# Patient Record
Sex: Female | Born: 1935 | Race: White | Hispanic: No | Smoking: Former smoker
Health system: Southern US, Community
[De-identification: ages and names within clinical notes are randomized; demographics above are authoritative.]

## PROBLEM LIST (undated history)

## (undated) DIAGNOSIS — M1990 Unspecified osteoarthritis, unspecified site: Secondary | ICD-10-CM

## (undated) DIAGNOSIS — C539 Malignant neoplasm of cervix uteri, unspecified: Secondary | ICD-10-CM

## (undated) DIAGNOSIS — C3490 Malignant neoplasm of unspecified part of unspecified bronchus or lung: Secondary | ICD-10-CM

## (undated) DIAGNOSIS — H269 Unspecified cataract: Secondary | ICD-10-CM

## (undated) DIAGNOSIS — I10 Essential (primary) hypertension: Secondary | ICD-10-CM

## (undated) DIAGNOSIS — K219 Gastro-esophageal reflux disease without esophagitis: Secondary | ICD-10-CM

## (undated) DIAGNOSIS — G4733 Obstructive sleep apnea (adult) (pediatric): Secondary | ICD-10-CM

## (undated) DIAGNOSIS — G4730 Sleep apnea, unspecified: Secondary | ICD-10-CM

## (undated) DIAGNOSIS — H3530 Unspecified macular degeneration: Secondary | ICD-10-CM

## (undated) HISTORY — DX: Unspecified macular degeneration: H35.30

## (undated) HISTORY — DX: Unspecified osteoarthritis, unspecified site: M19.90

## (undated) HISTORY — DX: Unspecified cataract: H26.9

## (undated) HISTORY — DX: Sleep apnea, unspecified: G47.30

---

## 1995-05-26 DIAGNOSIS — C539 Malignant neoplasm of cervix uteri, unspecified: Secondary | ICD-10-CM

## 1995-05-26 HISTORY — DX: Malignant neoplasm of cervix uteri, unspecified: C53.9

## 1995-05-26 HISTORY — PX: ABDOMINAL HYSTERECTOMY: SHX81

## 2000-05-25 HISTORY — PX: BREAST BIOPSY: SHX20

## 2004-11-28 ENCOUNTER — Ambulatory Visit: Payer: Self-pay | Admitting: Internal Medicine

## 2005-12-01 ENCOUNTER — Ambulatory Visit: Payer: Self-pay | Admitting: Internal Medicine

## 2006-12-28 ENCOUNTER — Ambulatory Visit: Payer: Self-pay | Admitting: Internal Medicine

## 2007-12-29 ENCOUNTER — Ambulatory Visit: Payer: Self-pay | Admitting: Internal Medicine

## 2008-06-25 ENCOUNTER — Ambulatory Visit: Payer: Self-pay | Admitting: Internal Medicine

## 2008-07-04 ENCOUNTER — Ambulatory Visit: Payer: Self-pay

## 2008-07-10 ENCOUNTER — Ambulatory Visit: Payer: Self-pay | Admitting: Internal Medicine

## 2008-07-23 ENCOUNTER — Ambulatory Visit: Payer: Self-pay | Admitting: Internal Medicine

## 2008-08-07 ENCOUNTER — Ambulatory Visit: Payer: Self-pay | Admitting: Physician Assistant

## 2008-09-22 ENCOUNTER — Ambulatory Visit: Payer: Self-pay | Admitting: Internal Medicine

## 2008-09-25 ENCOUNTER — Ambulatory Visit: Payer: Self-pay | Admitting: Internal Medicine

## 2008-10-23 ENCOUNTER — Ambulatory Visit: Payer: Self-pay | Admitting: Internal Medicine

## 2008-11-22 ENCOUNTER — Ambulatory Visit: Payer: Self-pay | Admitting: Internal Medicine

## 2008-12-10 ENCOUNTER — Ambulatory Visit: Payer: Self-pay | Admitting: Specialist

## 2008-12-12 ENCOUNTER — Ambulatory Visit: Payer: Self-pay | Admitting: Internal Medicine

## 2008-12-23 ENCOUNTER — Ambulatory Visit: Payer: Self-pay | Admitting: Internal Medicine

## 2009-01-08 ENCOUNTER — Ambulatory Visit: Payer: Self-pay | Admitting: Internal Medicine

## 2009-02-25 ENCOUNTER — Ambulatory Visit: Payer: Self-pay | Admitting: Gastroenterology

## 2009-03-25 ENCOUNTER — Ambulatory Visit: Payer: Self-pay | Admitting: Internal Medicine

## 2009-04-08 ENCOUNTER — Ambulatory Visit: Payer: Self-pay | Admitting: Specialist

## 2009-04-12 ENCOUNTER — Ambulatory Visit: Payer: Self-pay | Admitting: Internal Medicine

## 2009-04-24 ENCOUNTER — Ambulatory Visit: Payer: Self-pay | Admitting: Internal Medicine

## 2009-10-11 ENCOUNTER — Ambulatory Visit: Payer: Self-pay | Admitting: Internal Medicine

## 2009-10-23 ENCOUNTER — Ambulatory Visit: Payer: Self-pay | Admitting: Internal Medicine

## 2009-11-27 ENCOUNTER — Ambulatory Visit: Payer: Self-pay | Admitting: Ophthalmology

## 2010-01-22 ENCOUNTER — Ambulatory Visit: Payer: Self-pay | Admitting: Internal Medicine

## 2010-02-19 ENCOUNTER — Ambulatory Visit: Payer: Self-pay | Admitting: Ophthalmology

## 2010-04-11 ENCOUNTER — Ambulatory Visit: Payer: Self-pay | Admitting: Internal Medicine

## 2010-04-24 ENCOUNTER — Ambulatory Visit: Payer: Self-pay | Admitting: Internal Medicine

## 2010-05-25 HISTORY — PX: COLONOSCOPY: SHX174

## 2010-10-15 ENCOUNTER — Ambulatory Visit: Payer: Self-pay | Admitting: Otolaryngology

## 2010-10-16 ENCOUNTER — Ambulatory Visit: Payer: Self-pay | Admitting: Otolaryngology

## 2010-10-17 LAB — PATHOLOGY REPORT

## 2011-03-11 ENCOUNTER — Ambulatory Visit: Payer: Self-pay | Admitting: Specialist

## 2011-03-25 ENCOUNTER — Ambulatory Visit: Payer: Self-pay | Admitting: Internal Medicine

## 2011-09-11 ENCOUNTER — Ambulatory Visit: Payer: Self-pay | Admitting: Specialist

## 2011-09-11 LAB — CREATININE, SERUM
Creatinine: 0.78 mg/dL (ref 0.60–1.30)
EGFR (African American): >60
EGFR (Non-African Amer.): >60

## 2012-03-23 ENCOUNTER — Ambulatory Visit: Payer: Self-pay | Admitting: Specialist

## 2012-03-28 ENCOUNTER — Ambulatory Visit: Payer: Self-pay | Admitting: Internal Medicine

## 2012-09-21 ENCOUNTER — Ambulatory Visit: Payer: Self-pay | Admitting: Specialist

## 2013-03-29 ENCOUNTER — Ambulatory Visit: Payer: Self-pay | Admitting: Specialist

## 2013-03-29 ENCOUNTER — Ambulatory Visit: Payer: Self-pay | Admitting: Internal Medicine

## 2013-12-11 ENCOUNTER — Ambulatory Visit: Payer: Self-pay | Admitting: General Practice

## 2013-12-14 ENCOUNTER — Ambulatory Visit: Payer: Self-pay | Admitting: General Practice

## 2013-12-14 LAB — BASIC METABOLIC PANEL
Anion Gap: 6 — ABNORMAL LOW (ref 7–16)
BUN: 17 mg/dL (ref 7–18)
Calcium, Total: 9.8 mg/dL (ref 8.5–10.1)
Chloride: 102 mmol/L (ref 98–107)
Co2: 29 mmol/L (ref 21–32)
Creatinine: 0.81 mg/dL (ref 0.60–1.30)
EGFR (African American): >60
EGFR (Non-African Amer.): >60
Glucose: 110 mg/dL — ABNORMAL HIGH (ref 65–99)
Osmolality: 276 (ref 275–301)
Potassium: 4.1 mmol/L (ref 3.5–5.1)
Sodium: 137 mmol/L (ref 136–145)

## 2013-12-14 LAB — URINALYSIS, COMPLETE
Bilirubin,UR: NEGATIVE
Blood: NEGATIVE
Glucose,UR: NEGATIVE mg/dL (ref 0–75)
Ketone: NEGATIVE
Nitrite: POSITIVE
Ph: 7 (ref 4.5–8.0)
Protein: NEGATIVE
RBC,UR: 1 /HPF (ref 0–5)
Specific Gravity: 1.008 (ref 1.003–1.030)
Squamous Epithelial: <1
WBC UR: 69 /HPF (ref 0–5)

## 2013-12-14 LAB — MRSA PCR SCREENING

## 2013-12-14 LAB — APTT: Activated PTT: 31.7 secs (ref 23.6–35.9)

## 2013-12-14 LAB — CBC
HCT: 39.9 % (ref 35.0–47.0)
HGB: 13.6 g/dL (ref 12.0–16.0)
MCH: 29.4 pg (ref 26.0–34.0)
MCHC: 34 g/dL (ref 32.0–36.0)
MCV: 86 fL (ref 80–100)
Platelet: 149 10*3/uL — ABNORMAL LOW (ref 150–440)
RBC: 4.61 10*6/uL (ref 3.80–5.20)
RDW: 15.4 % — ABNORMAL HIGH (ref 11.5–14.5)
WBC: 9.7 10*3/uL (ref 3.6–11.0)

## 2013-12-14 LAB — SEDIMENTATION RATE: Erythrocyte Sed Rate: 25 mm/hr (ref 0–30)

## 2013-12-14 LAB — PROTIME-INR
INR: 1
Prothrombin Time: 13 secs (ref 11.5–14.7)

## 2013-12-15 ENCOUNTER — Ambulatory Visit: Payer: Self-pay | Admitting: General Practice

## 2013-12-16 LAB — BASIC METABOLIC PANEL
Anion Gap: 5 — ABNORMAL LOW (ref 7–16)
BUN: 16 mg/dL (ref 7–18)
Calcium, Total: 8 mg/dL — ABNORMAL LOW (ref 8.5–10.1)
Chloride: 106 mmol/L (ref 98–107)
Co2: 29 mmol/L (ref 21–32)
Creatinine: 0.99 mg/dL (ref 0.60–1.30)
EGFR (African American): >60
EGFR (Non-African Amer.): 53 — ABNORMAL LOW
Glucose: 97 mg/dL (ref 65–99)
Osmolality: 281 (ref 275–301)
Potassium: 4.1 mmol/L (ref 3.5–5.1)
Sodium: 140 mmol/L (ref 136–145)

## 2013-12-16 LAB — HEMOGLOBIN: HGB: 10.8 g/dL — ABNORMAL LOW (ref 12.0–16.0)

## 2013-12-16 LAB — PLATELET COUNT: Platelet: 128 10*3/uL — ABNORMAL LOW (ref 150–440)

## 2013-12-16 LAB — URINE CULTURE

## 2014-04-06 ENCOUNTER — Ambulatory Visit: Payer: Self-pay | Admitting: Internal Medicine

## 2014-09-15 NOTE — Op Note (Signed)
PATIENT NAME:  Kim Newman, Kim Newman MR#:  [id number] DATE OF BIRTH:  1927/10/12  DATE OF PROCEDURE:  12/15/2013  PREOPERATIVE DIAGNOSIS: Left proximal humerus fracture.   POSTOPERATIVE DIAGNOSIS: Left proximal humerus fracture.   PROCEDURE PERFORMED: Open reduction and internal fixation of left proximal humerus fracture.   SURGEON: Dr. Skip Estimable.   ANESTHESIA: Interscalene block and general.   ESTIMATED BLOOD LOSS: 500 mL.   FLUIDS REPLACED: 1000 mL of crystalloid.   DRAINS: One small drain to Hemovac reservoir.   IMPLANTS UTILIZED: Biomet 3-hole S3 proximal humerus plate, six 4.0-mm pegs, and three 3.8-mm cortical screws.   INDICATIONS FOR SURGERY: The patient is an 81 year old right-hand dominant female who fell and landed on her left shoulder. X-rays and CT scan demonstrated a left proximal humerus fracture. After discussion of the risks and benefits of surgical intervention, the patient expressed understanding of the risks and benefits and agreed with plans for surgical intervention.   PROCEDURE IN DETAIL: The patient was brought into the operating room and after adequate interscalene block and general endotracheal anesthesia was achieved, the patient is placed in the modified beach chair position. The head was secured in headrest and all bony prominences were well padded. The patient's left shoulder and arm were cleaned and prepped with alcohol and DuraPrep and draped in the usual sterile fashion. A "timeout" was performed as per usual protocol. The anticipated incision site was injected was 0.25% Marcaine with epinephrine. An anterior oblique incision was made extending from the midportion of the clavicle distally toward the insertion of the deltoid. Dissection was carried out so as to find the cephalic vein, which was then carefully retracted laterally. Deltopectoral interval was developed and dissection was carried down to the proximal humerus. A moderate-sized hematoma was evacuated.  Fracture site was visualized and provisional reduction was performed and confirmed using C-arm. The 3-hole S3 proximal humerus plate was then positioned, and a central K wire was inserted into the humeral head with position confirmed in multiple planes. Next, the distal portion of the plate was secured to the humeral shaft using a 3.5-mm cortical screw through the slotted hole. Good position was noted. A total of six 4.0-mm smooth pegs were inserted into the humeral head and position was confirmed using FluoroScan. Good position was noted. Next, the distal portion of the plate was further secured to the humeral shaft with 2 additional 3.8-mm cortical screws. The shoulder was placed through range of motion, with good stabilization of the fracture site noted. The wound was irrigated with copious amounts of normal saline with antibiotic solution using bulb syringe. A small Hemovac drain was placed in the wound bed and brought out through a separate stab incision. Good hemostasis was appreciated. The deltopectoral interval was reapproximated using interrupted sutures of #1 Ethibond. Subcutaneous tissue was approximated in layers using first number 0 Vicryl followed by number 2-0 Vicryl. Skin was closed with skin staples. Sterile dressing was applied followed by application of Polar Care and a sling. The patient tolerated the procedure well. She was transported to the recovery room in stable condition.    ____________________________ Laurice Record. Holley Bouche., MD jph:lt D: 12/15/2013 19:59:01 ET T: 12/16/2013 05:26:42 ET JOB#: [id number]  cc: Laurice Record. Holley Bouche., MD, <Dictator> Laurice Record Holley Bouche MD ELECTRONICALLY SIGNED 12/19/2013 23:54

## 2015-04-15 ENCOUNTER — Other Ambulatory Visit: Payer: Self-pay | Admitting: Internal Medicine

## 2015-04-15 DIAGNOSIS — Z1231 Encounter for screening mammogram for malignant neoplasm of breast: Secondary | ICD-10-CM

## 2015-06-19 ENCOUNTER — Other Ambulatory Visit: Payer: Self-pay | Admitting: Internal Medicine

## 2015-06-19 ENCOUNTER — Ambulatory Visit
Admission: RE | Admit: 2015-06-19 | Discharge: 2015-06-19 | Disposition: A | Discharge status: Home / Self Care | Payer: Medicare Other | Source: Ambulatory Visit | Attending: Internal Medicine | Admitting: Internal Medicine

## 2015-06-19 DIAGNOSIS — Z1231 Encounter for screening mammogram for malignant neoplasm of breast: Secondary | ICD-10-CM

## 2015-06-19 HISTORY — DX: Malignant neoplasm of cervix uteri, unspecified: C53.9

## 2015-11-21 ENCOUNTER — Ambulatory Visit: Payer: Medicare Other | Attending: Specialist

## 2015-11-21 DIAGNOSIS — G4761 Periodic limb movement disorder: Secondary | ICD-10-CM | POA: Diagnosis not present

## 2015-11-21 DIAGNOSIS — R400 Somnolence: Secondary | ICD-10-CM | POA: Diagnosis present

## 2015-11-21 DIAGNOSIS — G4710 Hypersomnia, unspecified: Secondary | ICD-10-CM | POA: Insufficient documentation

## 2015-11-21 DIAGNOSIS — G4733 Obstructive sleep apnea (adult) (pediatric): Secondary | ICD-10-CM | POA: Insufficient documentation

## 2016-01-09 ENCOUNTER — Other Ambulatory Visit: Payer: Self-pay | Admitting: Specialist

## 2016-01-09 DIAGNOSIS — J8410 Pulmonary fibrosis, unspecified: Secondary | ICD-10-CM

## 2016-01-21 ENCOUNTER — Ambulatory Visit
Admission: RE | Admit: 2016-01-21 | Discharge: 2016-01-21 | Disposition: A | Discharge status: Home / Self Care | Payer: Medicare Other | Source: Ambulatory Visit | Attending: Specialist | Admitting: Specialist

## 2016-01-21 DIAGNOSIS — I2510 Atherosclerotic heart disease of native coronary artery without angina pectoris: Secondary | ICD-10-CM | POA: Diagnosis not present

## 2016-01-21 DIAGNOSIS — J8410 Pulmonary fibrosis, unspecified: Secondary | ICD-10-CM | POA: Diagnosis not present

## 2016-01-21 DIAGNOSIS — I700 Atherosclerosis of aorta: Secondary | ICD-10-CM | POA: Insufficient documentation

## 2016-01-21 DIAGNOSIS — J479 Bronchiectasis, uncomplicated: Secondary | ICD-10-CM | POA: Insufficient documentation

## 2016-03-17 ENCOUNTER — Inpatient Hospital Stay
Admission: EM | Admit: 2016-03-17 | Discharge: 2016-03-18 | DRG: 811 | Disposition: A | Discharge status: Home / Self Care | Payer: Medicare Other | Attending: Specialist | Admitting: Specialist

## 2016-03-17 ENCOUNTER — Encounter: Payer: Self-pay | Admitting: Emergency Medicine

## 2016-03-17 DIAGNOSIS — G4733 Obstructive sleep apnea (adult) (pediatric): Secondary | ICD-10-CM | POA: Diagnosis present

## 2016-03-17 DIAGNOSIS — R531 Weakness: Secondary | ICD-10-CM | POA: Diagnosis present

## 2016-03-17 DIAGNOSIS — Z8541 Personal history of malignant neoplasm of cervix uteri: Secondary | ICD-10-CM

## 2016-03-17 DIAGNOSIS — Z79899 Other long term (current) drug therapy: Secondary | ICD-10-CM | POA: Diagnosis not present

## 2016-03-17 DIAGNOSIS — Z66 Do not resuscitate: Secondary | ICD-10-CM | POA: Diagnosis present

## 2016-03-17 DIAGNOSIS — D509 Iron deficiency anemia, unspecified: Secondary | ICD-10-CM | POA: Diagnosis not present

## 2016-03-17 DIAGNOSIS — Z7982 Long term (current) use of aspirin: Secondary | ICD-10-CM | POA: Diagnosis not present

## 2016-03-17 DIAGNOSIS — I10 Essential (primary) hypertension: Secondary | ICD-10-CM | POA: Diagnosis not present

## 2016-03-17 DIAGNOSIS — E785 Hyperlipidemia, unspecified: Secondary | ICD-10-CM | POA: Diagnosis not present

## 2016-03-17 DIAGNOSIS — K219 Gastro-esophageal reflux disease without esophagitis: Secondary | ICD-10-CM | POA: Diagnosis present

## 2016-03-17 DIAGNOSIS — J8410 Pulmonary fibrosis, unspecified: Secondary | ICD-10-CM | POA: Diagnosis not present

## 2016-03-17 DIAGNOSIS — R0602 Shortness of breath: Secondary | ICD-10-CM

## 2016-03-17 DIAGNOSIS — R262 Difficulty in walking, not elsewhere classified: Secondary | ICD-10-CM

## 2016-03-17 DIAGNOSIS — Z6828 Body mass index (BMI) 28.0-28.9, adult: Secondary | ICD-10-CM | POA: Diagnosis not present

## 2016-03-17 DIAGNOSIS — E43 Unspecified severe protein-calorie malnutrition: Secondary | ICD-10-CM | POA: Diagnosis present

## 2016-03-17 DIAGNOSIS — Z7951 Long term (current) use of inhaled steroids: Secondary | ICD-10-CM | POA: Diagnosis not present

## 2016-03-17 DIAGNOSIS — D649 Anemia, unspecified: Secondary | ICD-10-CM | POA: Diagnosis present

## 2016-03-17 HISTORY — DX: Gastro-esophageal reflux disease without esophagitis: K21.9

## 2016-03-17 HISTORY — DX: Essential (primary) hypertension: I10

## 2016-03-17 HISTORY — DX: Obstructive sleep apnea (adult) (pediatric): G47.33

## 2016-03-17 LAB — COMPREHENSIVE METABOLIC PANEL
ALT: 25 U/L (ref 14–54)
AST: 41 U/L (ref 15–41)
Albumin: 3.8 g/dL (ref 3.5–5.0)
Alkaline Phosphatase: 74 U/L (ref 38–126)
Anion gap: 10 (ref 5–15)
BUN: 17 mg/dL (ref 6–20)
CO2: 23 mmol/L (ref 22–32)
Calcium: 8.9 mg/dL (ref 8.9–10.3)
Chloride: 100 mmol/L — ABNORMAL LOW (ref 101–111)
Creatinine, Ser: 1.07 mg/dL — ABNORMAL HIGH (ref 0.44–1.00)
GFR calc Af Amer: 53 mL/min — ABNORMAL LOW (ref >60)
GFR calc non Af Amer: 46 mL/min — ABNORMAL LOW (ref >60)
Glucose, Bld: 158 mg/dL — ABNORMAL HIGH (ref 65–99)
Potassium: 4 mmol/L (ref 3.5–5.1)
Sodium: 133 mmol/L — ABNORMAL LOW (ref 135–145)
Total Bilirubin: 0.2 mg/dL — ABNORMAL LOW (ref 0.3–1.2)
Total Protein: 7.5 g/dL (ref 6.5–8.1)

## 2016-03-17 LAB — CBC
HCT: 20.2 % — ABNORMAL LOW (ref 35.0–47.0)
Hemoglobin: 5.9 g/dL — ABNORMAL LOW (ref 12.0–16.0)
MCH: 17.9 pg — ABNORMAL LOW (ref 26.0–34.0)
MCHC: 29.4 g/dL — ABNORMAL LOW (ref 32.0–36.0)
MCV: 60.9 fL — ABNORMAL LOW (ref 80.0–100.0)
Platelets: 176 10*3/uL (ref 150–440)
RBC: 3.31 MIL/uL — ABNORMAL LOW (ref 3.80–5.20)
RDW: 18.4 % — ABNORMAL HIGH (ref 11.5–14.5)
WBC: 7.2 10*3/uL (ref 3.6–11.0)

## 2016-03-17 LAB — URINALYSIS COMPLETE WITH MICROSCOPIC (ARMC ONLY)
Bacteria, UA: NONE SEEN
Bilirubin Urine: NEGATIVE
Glucose, UA: NEGATIVE mg/dL
Hgb urine dipstick: NEGATIVE
Ketones, ur: NEGATIVE mg/dL
Nitrite: NEGATIVE
Protein, ur: NEGATIVE mg/dL
Specific Gravity, Urine: 1.004 — ABNORMAL LOW (ref 1.005–1.030)
pH: 6 (ref 5.0–8.0)

## 2016-03-17 LAB — TROPONIN I: Troponin I: <0.03 ng/mL (ref <0.03)

## 2016-03-17 LAB — FERRITIN: Ferritin: 6 ng/mL — ABNORMAL LOW (ref 11–307)

## 2016-03-17 LAB — IRON AND TIBC
Iron: 8 ug/dL — ABNORMAL LOW (ref 28–170)
Saturation Ratios: 2 % — ABNORMAL LOW (ref 10.4–31.8)
TIBC: 536 ug/dL — ABNORMAL HIGH (ref 250–450)
UIBC: 529 ug/dL

## 2016-03-17 LAB — PREPARE RBC (CROSSMATCH)

## 2016-03-17 LAB — ABO/RH: ABO/RH(D): O POS

## 2016-03-17 MED ORDER — ACETAMINOPHEN 325 MG PO TABS: 650.0000 mg | ORAL_TABLET | Freq: Four times a day (QID) | ORAL | Status: DC | PRN

## 2016-03-17 MED ORDER — ONDANSETRON HCL 4 MG PO TABS: 4.0000 mg | ORAL_TABLET | Freq: Four times a day (QID) | ORAL | Status: DC | PRN

## 2016-03-17 MED ORDER — PANTOPRAZOLE SODIUM 40 MG PO TBEC
40.0000 mg | DELAYED_RELEASE_TABLET | Freq: Every day | ORAL | Status: DC
  Administered 2016-03-18: 40 mg via ORAL
  Filled 2016-03-17: qty 1

## 2016-03-17 MED ORDER — ONDANSETRON HCL 4 MG/2ML IJ SOLN: 4.0000 mg | Freq: Four times a day (QID) | INTRAMUSCULAR | Status: DC | PRN

## 2016-03-17 MED ORDER — MOMETASONE FURO-FORMOTEROL FUM 100-5 MCG/ACT IN AERO
2.0000 | INHALATION_SPRAY | Freq: Two times a day (BID) | RESPIRATORY_TRACT | Status: DC
  Administered 2016-03-18: 09:00:00 2 via RESPIRATORY_TRACT
  Filled 2016-03-17: qty 8.8

## 2016-03-17 MED ORDER — LORATADINE 10 MG PO TABS
10.0000 mg | ORAL_TABLET | Freq: Every day | ORAL | Status: DC
  Administered 2016-03-18: 09:00:00 10 mg via ORAL
  Filled 2016-03-17: qty 1

## 2016-03-17 MED ORDER — ACETAMINOPHEN 650 MG RE SUPP: 650.0000 mg | Freq: Four times a day (QID) | RECTAL | Status: DC | PRN

## 2016-03-17 MED ORDER — ASPIRIN EC 81 MG PO TBEC: 81.0000 mg | DELAYED_RELEASE_TABLET | Freq: Every day | ORAL | Status: DC

## 2016-03-17 MED ORDER — SODIUM CHLORIDE 0.9 % IV SOLN
10.0000 mL/h | Freq: Once | INTRAVENOUS | Status: AC
  Administered 2016-03-17: 10 mL/h via INTRAVENOUS

## 2016-03-17 MED ORDER — LOSARTAN POTASSIUM 25 MG PO TABS
50.0000 mg | ORAL_TABLET | Freq: Every day | ORAL | Status: DC
  Administered 2016-03-18: 50 mg via ORAL
  Filled 2016-03-17: qty 2

## 2016-03-17 NOTE — H&P (Signed)
[place] [place] NAME: Kim Newman    MR#:  [id number]  DATE OF BIRTH:  01-14-1928   DATE OF ADMISSION:  03/17/2016  PRIMARY CARE PHYSICIAN: Madelyn Brunner, MD   REQUESTING/REFERRING PHYSICIAN: Alfred Levins  CHIEF COMPLAINT:   Chief Complaint  Patient presents with  . Anemia  . Weakness    HISTORY OF PRESENT ILLNESS:  Kim Newman  is a 82 y.o. female with a known history of Essential hypertension, obstructive sleep apnea who is presenting with weakness She describes weakness progressively worsening for over a month workup with PCP revealed significant anemia sent to Hospital further workup and evaluation. She denies any bruising bleeding or other abnormal symptoms other than weakness. Denies any weight loss  PAST MEDICAL HISTORY:   Past Medical History:  Diagnosis Date  . Cervical cancer ([place]) 1997    PAST SURGICAL HISTORY:   Past Surgical History:  Procedure Laterality Date  . BREAST BIOPSY     core 2002    SOCIAL HISTORY:   Social History  Substance Use Topics  . Smoking status: Never Smoker  . Smokeless tobacco: Current User  . Alcohol use No    FAMILY HISTORY:   Family History  Problem Relation Age of Onset  . Breast cancer Neg Hx     DRUG ALLERGIES:   Allergies  Allergen Reactions  . Statins   . Strawberry (Diagnostic)     REVIEW OF SYSTEMS:  REVIEW OF SYSTEMS:  CONSTITUTIONAL: Denies fevers, chills,Positive fatigue, weakness.  EYES: Denies blurred vision, double vision, or eye pain.  EARS, NOSE, THROAT: Denies tinnitus, ear pain, hearing loss.  RESPIRATORY: denies cough, shortness of breath, wheezing  CARDIOVASCULAR: Denies chest pain, palpitations, edema.  GASTROINTESTINAL: Denies nausea, vomiting, diarrhea, abdominal pain.  GENITOURINARY: Denies dysuria, hematuria.  ENDOCRINE: Denies nocturia or thyroid problems. HEMATOLOGIC AND LYMPHATIC: Denies easy bruising or bleeding.  SKIN:  Denies rash or lesions.  MUSCULOSKELETAL: Denies pain in neck, back, shoulder, knees, hips, or further arthritic symptoms.  NEUROLOGIC: Denies paralysis, paresthesias.  PSYCHIATRIC: Denies anxiety or depressive symptoms. Otherwise full review of systems performed by me is negative.   MEDICATIONS AT HOME:   Prior to Admission medications   Not on File      VITAL SIGNS:  Blood pressure (!) 128/52, pulse 91, temperature 97.9 F (36.6 C), temperature source Oral, resp. rate 18, height '5\' 2"'$  (1.575 m), weight 72.1 kg (159 lb), SpO2 94 %.  PHYSICAL EXAMINATION:  VITAL SIGNS: Vitals:   03/17/16 1551  BP: (!) 128/52  Pulse: 91  Resp: 18  Temp: 97.9 F (36.6 C)   GENERAL:82 y.o.female currently in no acute distress.  HEAD: Normocephalic, atraumatic.  EYES: Pupils equal, round, reactive to light. Extraocular muscles intact. No scleral icterus.  MOUTH: Moist mucosal membrane. Dentition intact. No abscess noted.  EAR, NOSE, THROAT: Clear without exudates. No external lesions.  NECK: Supple. No thyromegaly. No nodules. No JVD.  PULMONARY: Clear to ascultation, without wheeze rails or rhonci. No use of accessory muscles, Good respiratory effort. good air entry bilaterally CHEST: Nontender to palpation.  CARDIOVASCULAR: S1 and S2. Regular rate and rhythm. No murmurs, rubs, or gallops. No edema. Pedal pulses 2+ bilaterally.  GASTROINTESTINAL: Soft, nontender, nondistended. No masses. Positive bowel sounds. No hepatosplenomegaly.  MUSCULOSKELETAL: No swelling, clubbing, or edema. Range of motion full in all extremities.  NEUROLOGIC: Cranial nerves II through XII are intact. No gross focal neurological deficits. Sensation intact. Reflexes intact.  SKIN: No ulceration, lesions, rashes, or cyanosis. Skin warm and dry. Turgor intact.  PSYCHIATRIC: Mood, affect within normal limits. The patient is awake, alert and oriented x 3. Insight, judgment intact.    LABORATORY PANEL:   CBC  Recent  Labs Lab 03/17/16 1555  WBC 7.2  HGB 5.9*  HCT 20.2*  PLT 176   ------------------------------------------------------------------------------------------------------------------  Chemistries   Recent Labs Lab 03/17/16 1555  NA 133*  K 4.0  CL 100*  CO2 23  GLUCOSE 158*  BUN 17  CREATININE 1.07*  CALCIUM 8.9  AST 41  ALT 25  ALKPHOS 74  BILITOT 0.2*   ------------------------------------------------------------------------------------------------------------------  Cardiac Enzymes No results for input(s): TROPONINI in the last 168 hours. ------------------------------------------------------------------------------------------------------------------  RADIOLOGY:  No results found.  EKG:   Orders placed or performed during the hospital encounter of 03/17/16  . ED EKG  . ED EKG    IMPRESSION AND PLAN:   82 year old Caucasian female history of essential hypertension obstructive sleep apnea who is presenting with weakness  1. Symptomatically anemia: Transfuse 2 unit packed red blood cell, check iron studies given microcytic anemia 2. Obstructive sleep apnea: CPAP at nighttime 3. GERD without esophagitis: PPI therapy 4. Essential tension losartan    All the records are reviewed and case discussed with ED provider. Management plans discussed with the patient, family and they are in agreement.  CODE STATUS: Full  TOTAL TIME TAKING CARE OF THIS PATIENT: 33 minutes.    Hower,  Karenann Cai.D on 03/17/2016 at 5:21 PM  Between 7am to 6pm - Pager - [phone]  After 6pm: House Pager: - [facility]  Office  [phone]  CC: Primary care physician; Madelyn Brunner, MD

## 2016-03-17 NOTE — ED Triage Notes (Addendum)
Sent over per PCP for low HGB 5.3, blood work taken today at [facility]. Pt states has been fatigued for about one mth. Denies any rectal bleeding. Pt noted to be very pale in triage. Pt a/ox3.

## 2016-03-17 NOTE — ED Notes (Signed)
Pt in via triage; pt sent over from PCP due to abnormal blood work.  Pt with increasing weakness and shortness of breath on exertion since May of this year.  Pt pale in color.  Pt A/Ox4, vitals WDL, no immediate distress at this time.  Admitting MD to bedside at this time.

## 2016-03-17 NOTE — ED Provider Notes (Signed)
[facility] Emergency Department Provider Note  ____________________________________________  Time seen: Approximately 4:43 PM  I have reviewed the triage vital signs and the nursing notes.   HISTORY  Chief Complaint Anemia and Weakness   HPI Kim Newman is a 82 y.o. female with h/o remote cervical cancer status post radical resection, hypertension, hyperlipidemia, pulmonary fibrosis who presents for evaluation of symptomatic anemia. Patient went to see her primary care doctor but complains of a month of severe fatigue and dyspnea on exertion. Patient reports that over the course of the last few days she has been unable to walk from room to room in her house without feeling extremely fatigued and short of breath. PCP did blood work and found the patient was anemic with hemoglobin less than 6 and sent patient to the emergency room for evaluation. Patient does not take any blood thinners. No prior history of GI bleed or colon cancer. She denies melena, hematemesis, hematochezia, vaginal bleeding, hematuria, hemoptysis. Patient denies any chest pain or syncope. She denies shortness of breath at rest. Patient has no prior history of anemia according to her.  Past Medical History:  Diagnosis Date  . Cervical cancer ([place]) 1997  . GERD (gastroesophageal reflux disease)   . Hypertension   . Obstructive sleep apnea     Patient Active Problem List   Diagnosis Date Noted  . Symptomatic anemia 03/17/2016    Past Surgical History:  Procedure Laterality Date  . BREAST BIOPSY     core 2002    Prior to Admission medications   Medication Sig Start Date End Date Taking? Authorizing Provider  ADVAIR DISKUS 100-50 MCG/DOSE AEPB Inhale 2 puffs into the lungs daily. 03/04/16  Yes Historical Provider, MD  aspirin EC 81 MG tablet Take 1 tablet by mouth daily.   Yes Historical Provider, MD  calcium carbonate (OS-CAL) 1250 (500 Ca) MG chewable tablet Chew 1 tablet by  mouth 2 (two) times daily.   Yes Historical Provider, MD  cetirizine (ZYRTEC) 10 MG tablet Take 1 tablet by mouth daily.   Yes Historical Provider, MD  Cholecalciferol (VITAMIN D-1000 MAX ST) 1000 units tablet Take 2 tablets by mouth daily.   Yes Historical Provider, MD  Cyanocobalamin (RA VITAMIN B-12 TR) 1000 MCG TBCR Take 2 tablets by mouth daily.   Yes Historical Provider, MD  DYMISTA 137-50 MCG/ACT SUSP Place 1 spray into the nose daily. 02/10/16  Yes Historical Provider, MD  losartan (COZAAR) 50 MG tablet Take 1 tablet by mouth daily. 02/14/16  Yes Historical Provider, MD  Multiple Vitamins-Minerals (PRESERVISION AREDS 2 PO) Take 1 tablet by mouth 2 (two) times daily.    Yes Historical Provider, MD  Omega-3 Fatty Acids (FISH OIL PO) Take 1 capsule by mouth daily.   Yes Historical Provider, MD  omeprazole (PRILOSEC) 40 MG capsule Take 1 capsule by mouth daily.   Yes Historical Provider, MD    Allergies Statins and Strawberry (diagnostic)  Family History  Problem Relation Age of Onset  . Breast cancer Neg Hx     Social History Social History  Substance Use Topics  . Smoking status: Never Smoker  . Smokeless tobacco: Current User  . Alcohol use No    Review of Systems  Constitutional: Negative for fever. + generalized fatigue Eyes: Negative for visual changes. ENT: Negative for sore throat. Cardiovascular: Negative for chest pain. Respiratory: + DOE Gastrointestinal: Negative for abdominal pain, vomiting or diarrhea. Genitourinary: Negative for dysuria. Musculoskeletal: Negative for back pain. Skin:  Negative for rash. Neurological: Negative for headaches, weakness or numbness.  ____________________________________________   PHYSICAL EXAM:  VITAL SIGNS: ED Triage Vitals [03/17/16 1551]  Enc Vitals Group     BP (!) 128/52     Pulse Rate 91     Resp 18     Temp 97.9 F (36.6 C)     Temp Source Oral     SpO2 94 %     Weight 159 lb (72.1 kg)     Height '5\' 2"'$  (1.575  m)     Head Circumference      Peak Flow      Pain Score      Pain Loc      Pain Edu?      Excl. in [place]?     Constitutional: Alert and oriented. Well appearing and in no apparent distress. HEENT:      Head: Normocephalic and atraumatic.         Eyes: Conjunctivae are normal. Sclera is non-icteric. EOMI. PERRL      Mouth/Throat: Mucous membranes are moist.       Neck: Supple with no signs of meningismus. Cardiovascular: Regular rate and rhythm. No murmurs, gallops, or rubs. 2+ symmetrical distal pulses are present in all extremities. No JVD. Respiratory: Normal respiratory effort. Lungs are clear to auscultation bilaterally. No wheezes, crackles, or rhonchi.  Gastrointestinal: Soft, non tender, and non distended with positive bowel sounds. No rebound or guarding. Genitourinary: No CVA tenderness. Rectal exam showing brown stool Hemoccult negative Musculoskeletal: Nontender with normal range of motion in all extremities. No edema, cyanosis, or erythema of extremities. Neurologic: Normal speech and language. Face is symmetric. Moving all extremities. No gross focal neurologic deficits are appreciated. Skin: Skin is warm, dry and intact. No rash noted. Psychiatric: Mood and affect are normal. Speech and behavior are normal.  ____________________________________________   LABS (all labs ordered are listed, but only abnormal results are displayed)  Labs Reviewed  COMPREHENSIVE METABOLIC PANEL - Abnormal; Notable for the following:       Result Value   Sodium 133 (*)    Chloride 100 (*)    Glucose, Bld 158 (*)    Creatinine, Ser 1.07 (*)    Total Bilirubin 0.2 (*)    GFR calc non Af Amer 46 (*)    GFR calc Af Amer 53 (*)    All other components within normal limits  CBC - Abnormal; Notable for the following:    RBC 3.31 (*)    Hemoglobin 5.9 (*)    HCT 20.2 (*)    MCV 60.9 (*)    MCH 17.9 (*)    MCHC 29.4 (*)    RDW 18.4 (*)    All other components within normal limits    URINALYSIS COMPLETEWITH MICROSCOPIC ([facility] ONLY) - Abnormal; Notable for the following:    Color, Urine YELLOW (*)    APPearance CLEAR (*)    Specific Gravity, Urine 1.004 (*)    Leukocytes, UA 1+ (*)    Squamous Epithelial / LPF 0-5 (*)    All other components within normal limits  IRON AND TIBC - Abnormal; Notable for the following:    Iron 8 (*)    TIBC 536 (*)    Saturation Ratios 2 (*)    All other components within normal limits  FERRITIN - Abnormal; Notable for the following:    Ferritin 6 (*)    All other components within normal limits  TROPONIN I  CBC  BASIC  METABOLIC PANEL  POC OCCULT BLOOD, ED  TYPE AND SCREEN  PREPARE RBC (CROSSMATCH)  ABO/RH   ____________________________________________  EKG  none  ____________________________________________  RADIOLOGY  none  ____________________________________________   PROCEDURES  Procedure(s) performed: None Procedures Critical Care performed:  None ____________________________________________   INITIAL IMPRESSION / ASSESSMENT AND PLAN / ED COURSE   82 y.o. female with h/o remote cervical cancer status post radical resection, hypertension, hyperlipidemia, pulmonary fibrosis who presents for evaluation of symptomatic anemia. Repeat hemoglobin in the emergency room at 5.9. Rectal exam showing brown stool Hemoccult negative. Patient with no known active bleeding. She is HD stable. Patient was consented for blood. Type and screen is pending. We'll transfuse 2 units and admitted to the hospitalist service.  Clinical Course    Pertinent labs & imaging results that were available during my care of the patient were reviewed by me and considered in my medical decision making (see chart for details).    ____________________________________________   FINAL CLINICAL IMPRESSION(S) / ED DIAGNOSES  Final diagnoses:  Anemia, unspecified type  Shortness of breath      NEW MEDICATIONS STARTED DURING THIS  VISIT:  Current Discharge Medication List       Note:  This document was prepared using Dragon voice recognition software and may include unintentional dictation errors.    Rudene Re, MD 03/17/16 2234

## 2016-03-18 DIAGNOSIS — D509 Iron deficiency anemia, unspecified: Secondary | ICD-10-CM | POA: Diagnosis not present

## 2016-03-18 DIAGNOSIS — Z8541 Personal history of malignant neoplasm of cervix uteri: Secondary | ICD-10-CM

## 2016-03-18 DIAGNOSIS — R531 Weakness: Secondary | ICD-10-CM | POA: Diagnosis not present

## 2016-03-18 DIAGNOSIS — K219 Gastro-esophageal reflux disease without esophagitis: Secondary | ICD-10-CM | POA: Diagnosis not present

## 2016-03-18 DIAGNOSIS — G4730 Sleep apnea, unspecified: Secondary | ICD-10-CM

## 2016-03-18 DIAGNOSIS — R5383 Other fatigue: Secondary | ICD-10-CM

## 2016-03-18 DIAGNOSIS — Z79899 Other long term (current) drug therapy: Secondary | ICD-10-CM

## 2016-03-18 DIAGNOSIS — I10 Essential (primary) hypertension: Secondary | ICD-10-CM

## 2016-03-18 LAB — CBC
HCT: 25 % — ABNORMAL LOW (ref 35.0–47.0)
Hemoglobin: 7.9 g/dL — ABNORMAL LOW (ref 12.0–16.0)
MCH: 20.9 pg — ABNORMAL LOW (ref 26.0–34.0)
MCHC: 31.5 g/dL — ABNORMAL LOW (ref 32.0–36.0)
MCV: 66.4 fL — ABNORMAL LOW (ref 80.0–100.0)
Platelets: 131 10*3/uL — ABNORMAL LOW (ref 150–440)
RBC: 3.76 MIL/uL — ABNORMAL LOW (ref 3.80–5.20)
RDW: 25 % — ABNORMAL HIGH (ref 11.5–14.5)
WBC: 7.3 10*3/uL (ref 3.6–11.0)

## 2016-03-18 LAB — BASIC METABOLIC PANEL
Anion gap: 6 (ref 5–15)
BUN: 19 mg/dL (ref 6–20)
CO2: 25 mmol/L (ref 22–32)
Calcium: 8.7 mg/dL — ABNORMAL LOW (ref 8.9–10.3)
Chloride: 105 mmol/L (ref 101–111)
Creatinine, Ser: 0.79 mg/dL (ref 0.44–1.00)
GFR calc Af Amer: >60 mL/min (ref >60)
GFR calc non Af Amer: >60 mL/min (ref >60)
Glucose, Bld: 105 mg/dL — ABNORMAL HIGH (ref 65–99)
Potassium: 4.2 mmol/L (ref 3.5–5.1)
Sodium: 136 mmol/L (ref 135–145)

## 2016-03-18 MED ORDER — ENSURE ENLIVE PO LIQD: 237.0000 mL | Freq: Two times a day (BID) | ORAL | Status: DC

## 2016-03-18 MED ORDER — FERROUS SULFATE 325 (65 FE) MG PO TABS: 325.0000 mg | ORAL_TABLET | Freq: Two times a day (BID) | ORAL | 3 refills | Status: DC

## 2016-03-18 NOTE — Consult Note (Signed)
[place]  [phone] [phone]  ID: JEWELIANA DUDGEON OB: Dec 23, 1927  MR#: [id number]  URK#:[id number]  Patient Care Team: Madelyn Brunner, MD as PCP - General (Internal Medicine)  CHIEF COMPLAINT: Severe iron deficiency anemia.  INTERVAL HISTORY: Patient is an 82 year old female who presented to the emergency room with slowly progressive weakness and fatigue to the point where is affecting her day-to-day activity. She also had significant dyspnea on exertion. Upon evaluation she was found to have significant iron deficiency anemia. She received 2 units of packed red blood cells with improvement of her symptoms. She has no neurologic complaints. She denies any recent fevers or illnesses. She has a good appetite and denies weight loss. She denies any chest pain. She has no nausea, vomiting, constipation, or diarrhea. She has no melena or hematochezia. She has no urinary complaints. Patient feels otherwise well and offers no further specific complaints.  REVIEW OF SYSTEMS:   Review of Systems  Constitutional: Positive for malaise/fatigue. Negative for fever and weight loss.  Respiratory: Negative.  Negative for cough and shortness of breath.   Cardiovascular: Negative.  Negative for chest pain and leg swelling.  Gastrointestinal: Negative.  Negative for abdominal pain, blood in stool and melena.  Musculoskeletal: Negative.   Neurological: Positive for weakness.  Psychiatric/Behavioral: Negative.  The patient is not nervous/anxious.     As per HPI. Otherwise, a complete review of systems is negative.  PAST MEDICAL HISTORY: Past Medical History:  Diagnosis Date  . Cervical cancer ([place]) 1997  . GERD (gastroesophageal reflux disease)   . Hypertension   . Obstructive sleep apnea     PAST SURGICAL HISTORY: Past Surgical History:  Procedure Laterality Date  . BREAST BIOPSY     core 2002    FAMILY HISTORY: Family History  Problem Relation  Age of Onset  . Breast cancer Neg Hx     ADVANCED DIRECTIVES (Y/N):  '@ADVDIR'$ @  HEALTH MAINTENANCE: Social History  Substance Use Topics  . Smoking status: Never Smoker  . Smokeless tobacco: Current User  . Alcohol use No     Colonoscopy:  PAP:  Bone density:  Lipid panel:  Allergies  Allergen Reactions  . Statins   . Strawberry (Diagnostic)     Current Facility-Administered Medications  Medication Dose Route Frequency Provider Last Rate Last Dose  . acetaminophen (TYLENOL) tablet 650 mg  650 mg Oral Q6H PRN Lytle Butte, MD       Or  . acetaminophen (TYLENOL) suppository 650 mg  650 mg Rectal Q6H PRN Lytle Butte, MD      . feeding supplement (ENSURE ENLIVE) (ENSURE ENLIVE) liquid 237 mL  237 mL Oral BID BM Henreitta Leber, MD      . loratadine (CLARITIN) tablet 10 mg  10 mg Oral Daily Lytle Butte, MD   10 mg at 03/18/16 0919  . losartan (COZAAR) tablet 50 mg  50 mg Oral Daily Lytle Butte, MD   50 mg at 03/18/16 8315  . mometasone-formoterol (DULERA) 100-5 MCG/ACT inhaler 2 puff  2 puff Inhalation BID Lytle Butte, MD   2 puff at 03/18/16 0830  . ondansetron (ZOFRAN) tablet 4 mg  4 mg Oral Q6H PRN Lytle Butte, MD       Or  . ondansetron [facility]) injection 4 mg  4 mg Intravenous Q6H PRN Lytle Butte, MD      . pantoprazole (PROTONIX) EC tablet 40 mg  40 mg Oral  Daily Lytle Butte, MD   40 mg at 03/18/16 6256    OBJECTIVE: Vitals:   03/18/16 0419 03/18/16 1234  BP: (!) 153/64 (!) 145/56  Pulse: 74 86  Resp: 19 20  Temp: 97.8 F (36.6 C) 97.6 F (36.4 C)     Body mass index is 28.68 kg/m.    ECOG FS:0 - Asymptomatic  General: Well-developed, well-nourished, no acute distress. Eyes: Pink conjunctiva, anicteric sclera. HEENT: Normocephalic, moist mucous membranes, clear oropharnyx. Lungs: Clear to auscultation bilaterally. Heart: Regular rate and rhythm. No rubs, murmurs, or gallops. Abdomen: Soft, nontender, nondistended. No organomegaly noted,  normoactive bowel sounds. Musculoskeletal: No edema, cyanosis, or clubbing. Neuro: Alert, answering all questions appropriately. Cranial nerves grossly intact. Skin: No rashes or petechiae noted. Psych: Normal affect. Lymphatics: No cervical, calvicular, axillary or inguinal LAD.   LAB RESULTS:  Lab Results  Component Value Date   NA 136 03/18/2016   K 4.2 03/18/2016   CL 105 03/18/2016   CO2 25 03/18/2016   GLUCOSE 105 (H) 03/18/2016   BUN 19 03/18/2016   CREATININE 0.79 03/18/2016   CALCIUM 8.7 (L) 03/18/2016   PROT 7.5 03/17/2016   ALBUMIN 3.8 03/17/2016   AST 41 03/17/2016   ALT 25 03/17/2016   ALKPHOS 74 03/17/2016   BILITOT 0.2 (L) 03/17/2016   GFRNONAA >60 03/18/2016   GFRAA >60 03/18/2016    Lab Results  Component Value Date   WBC 7.3 03/18/2016   HGB 7.9 (L) 03/18/2016   HCT 25.0 (L) 03/18/2016   MCV 66.4 (L) 03/18/2016   PLT 131 (L) 03/18/2016   Lab Results  Component Value Date   IRON 8 (L) 03/17/2016   TIBC 536 (H) 03/17/2016   IRONPCTSAT 2 (L) 03/17/2016   Lab Results  Component Value Date   FERRITIN 6 (L) 03/17/2016     STUDIES: No results found.  ASSESSMENT: Severe iron deficiency anemia.  PLAN:    1. Severe iron deficiency anemia:  Patient's hemoglobin and iron stores were significantly decreased upon admission. She received 2 units of packed red blood cells with improvement of her hemoglobin to 7.9. Patient also reports her symptoms are improved. Unclear etiology of her iron deficiency. She does not report any bleeding. She denies any melena or hematochezia. Awaiting GI input. Okay to discharge from a hematology standpoint. Please insure patient follows up in the [place] in 2-3 weeks for repeat laboratory work and further evaluation. Please also insure patient is discharged on oral iron supplementation.  Appreciate consult, call with questions.  Lloyd Huger, MD   03/18/2016 4:20 PM

## 2016-03-18 NOTE — Discharge Summary (Signed)
[facility] NAME: Kim Newman    MR#:  [id number]  DATE OF BIRTH:  1927-10-01  DATE OF ADMISSION:  03/17/2016 ADMITTING PHYSICIAN: Lytle Butte, MD  DATE OF DISCHARGE: 03/18/2016  PRIMARY CARE PHYSICIAN: Madelyn Brunner, MD    ADMISSION DIAGNOSIS:  Shortness of breath [R06.02] Anemia, unspecified type [D64.9]  DISCHARGE DIAGNOSIS:  Active Problems:   Symptomatic anemia   SECONDARY DIAGNOSIS:   Past Medical History:  Diagnosis Date  . Cervical cancer ([facility]) 1997  . GERD (gastroesophageal reflux disease)   . Hypertension   . Obstructive sleep apnea     HOSPITAL COURSE:   82 year old female with past medical history of cervical cancer, GERD, hypertension, obstructive sleep apnea who presented to the hospital due to weakness and noted to have symptomatic anemia.  1. Symptomatic anemia-this was the cause of patient's shortness of breath and weakness. Patient on blood work was noted to have severe iron deficiency anemia. -Patient was transfused a total of 2 units of packed red blood cells. Patient's hemoglobin is improved posttransfusion. She is clinically stable and asymptomatic now. -She was seen by hematology oncology and they recommended discharge on oral iron with follow-up at the cancer center as an outpatient in next couple weeks.  -patient was Hemoccult negative on rectal exam by the ER physician.   2. Essential hypertension-patient will resume her losartan.  3. GERD-patient will resume her omeprazole.  4. History of obstructive sleep apnea-patient will resume her CPAP.  DISCHARGE CONDITIONS:   Stable  CONSULTS OBTAINED:  Treatment Team:  Lytle Butte, MD Lloyd Huger, MD  DRUG ALLERGIES:   Allergies  Allergen Reactions  . Statins   . Strawberry (Diagnostic)     DISCHARGE MEDICATIONS:     Medication List    TAKE these medications   ADVAIR DISKUS 100-50 MCG/DOSE Aepb Generic drug:   Fluticasone-Salmeterol Inhale 2 puffs into the lungs daily.   aspirin EC 81 MG tablet Take 1 tablet by mouth daily.   calcium carbonate 1250 (500 Ca) MG chewable tablet Commonly known as:  OS-CAL Chew 1 tablet by mouth 2 (two) times daily.   cetirizine 10 MG tablet Commonly known as:  ZYRTEC Take 1 tablet by mouth daily.   DYMISTA 137-50 MCG/ACT Susp Generic drug:  Azelastine-Fluticasone Place 1 spray into the nose daily.   ferrous sulfate 325 (65 FE) MG tablet Take 1 tablet (325 mg total) by mouth 2 (two) times daily with a meal.   FISH OIL PO Take 1 capsule by mouth daily.   losartan 50 MG tablet Commonly known as:  COZAAR Take 1 tablet by mouth daily.   omeprazole 40 MG capsule Commonly known as:  PRILOSEC Take 1 capsule by mouth daily.   PRESERVISION AREDS 2 PO Take 1 tablet by mouth 2 (two) times daily.   RA VITAMIN B-12 TR 1000 MCG Tbcr Generic drug:  Cyanocobalamin Take 2 tablets by mouth daily.   VITAMIN D-1000 MAX ST 1000 units tablet Generic drug:  Cholecalciferol Take 2 tablets by mouth daily.         DISCHARGE INSTRUCTIONS:   DIET:  Cardiac diet  DISCHARGE CONDITION:  Stable  ACTIVITY:  Activity as tolerated  OXYGEN:  Home Oxygen: No.   Oxygen Delivery: room air  DISCHARGE LOCATION:  home   If you experience worsening of your admission symptoms, develop shortness of breath, life threatening emergency, suicidal or homicidal thoughts you must seek medical attention  immediately by calling 911 or calling your MD immediately  if symptoms less severe.  You Must read complete instructions/literature along with all the possible adverse reactions/side effects for all the Medicines you take and that have been prescribed to you. Take any new Medicines after you have completely understood and accpet all the possible adverse reactions/side effects.   Please note  You were cared for by a hospitalist during your hospital stay. If you have any  questions about your discharge medications or the care you received while you were in the hospital after you are discharged, you can call the unit and asked to speak with the hospitalist on call if the hospitalist that took care of you is not available. Once you are discharged, your primary care physician will handle any further medical issues. Please note that NO REFILLS for any discharge medications will be authorized once you are discharged, as it is imperative that you return to your primary care physician (or establish a relationship with a primary care physician if you do not have one) for your aftercare needs so that they can reassess your need for medications and monitor your lab values.     Today   Weakness, shortness of breath much improved. No chest pain. Hemoglobin improved posttransfusion. No other acute events overnight.  VITAL SIGNS:  Blood pressure (!) 145/56, pulse 86, temperature 97.6 F (36.4 C), temperature source Oral, resp. rate 20, height '5\' 2"'$  (1.575 m), weight 71.1 kg (156 lb 12.8 oz), SpO2 91 %.  I/O:   Intake/Output Summary (Last 24 hours) at 03/18/16 1419 Last data filed at 03/18/16 0800  Gross per 24 hour  Intake              800 ml  Output              350 ml  Net              450 ml    PHYSICAL EXAMINATION:  GENERAL:  82 y.o.-year-old patient lying in the bed with no acute distress.  EYES: Pupils equal, round, reactive to light and accommodation. No scleral icterus. Extraocular muscles intact.  HEENT: Head atraumatic, normocephalic. Oropharynx and nasopharynx clear.  NECK:  Supple, no jugular venous distention. No thyroid enlargement, no tenderness.  LUNGS: Normal breath sounds bilaterally, no wheezing, rales,rhonchi. No use of accessory muscles of respiration.  CARDIOVASCULAR: S1, S2 normal. No murmurs, rubs, or gallops.  ABDOMEN: Soft, non-tender, non-distended. Bowel sounds present. No organomegaly or mass.  EXTREMITIES: No pedal edema, cyanosis, or  clubbing.  NEUROLOGIC: Cranial nerves II through XII are intact. No focal motor or sensory defecits b/l.  PSYCHIATRIC: The patient is alert and oriented x 3. Good affect.  SKIN: No obvious rash, lesion, or ulcer.   DATA REVIEW:   CBC  Recent Labs Lab 03/18/16 0433  WBC 7.3  HGB 7.9*  HCT 25.0*  PLT 131*    Chemistries   Recent Labs Lab 03/17/16 1555 03/18/16 0433  NA 133* 136  K 4.0 4.2  CL 100* 105  CO2 23 25  GLUCOSE 158* 105*  BUN 17 19  CREATININE 1.07* 0.79  CALCIUM 8.9 8.7*  AST 41  --   ALT 25  --   ALKPHOS 74  --   BILITOT 0.2*  --     Cardiac Enzymes  Recent Labs Lab 03/17/16 [place] <0.03    Microbiology Results  No results found for this or any previous visit.  RADIOLOGY:  No  results found.    Management plans discussed with the patient, family and they are in agreement.  CODE STATUS:     Code Status Orders        Start     Ordered   03/17/16 1654  Full code  Continuous     03/17/16 1653    Code Status History    Date Active Date Inactive Code Status Order ID Comments User Context   This patient has a current code status but no historical code status.    Advance Directive Documentation   Flowsheet Row Most Recent Value  Type of Advance Directive  Healthcare Power of Attorney  Pre-existing out of facility DNR order (yellow form or pink MOST form)  No data  "MOST" Form in Place?  No data      TOTAL TIME TAKING CARE OF THIS PATIENT: 40 minutes.    Henreitta Leber M.D on 03/18/2016 at 2:19 PM  Between 7am to 6pm - Pager - [phone]  After 6pm go to [email or web address] - Proofreader  [facility]  Office  [phone]  CC: Primary care physician; Madelyn Brunner, MD

## 2016-03-18 NOTE — Care Management (Signed)
Admitted to [facility] with the diagnosis of anemia. Lives with daughter, Kim Newman, [phone]). Home Health in the past 4 & 20 years ago. Doesn't remember name of agency. No skilled facility. No home oxygen. Uses a cane to aid in ambulation. Last fall was 2 years ago. Decreased appetite, but has never eaten a lot of food. Prescriptions are filled at [facility] in [place].  Physical therapy evaluation completed. Recommending outpatient therapy. Discussed this recommendation with Ms. Bo Merino and daughter. Would like to see if she improves with her ambulatory skilled before makin a decision. Shelbie Ammons RN MSN CCM Care Management [phone]

## 2016-03-18 NOTE — Evaluation (Signed)
Physical Therapy Evaluation Patient Details Name: Kim Newman MRN: [id number] DOB: 22-May-1928 Today's Date: 03/18/2016   History of Present Illness  Pt is a 82 y/o F who presented with weakness and SOB over the past month.  Workup with PCP revealed anemia and was sent to the hospital.  Pt's PMH includes cervical cancer.    Clinical Impression  Pt admitted with above diagnosis. Pt currently with functional limitations due to the deficits listed below (see PT Problem List). Kim Newman was eager to work with PT and to ambulate in hall.  Pt scored 47/56 on Berg indicating pt is at a risk for falling.  She ambulated 500 ft with reports of SOB at end of ambulation, SpO2 as low as 89% on RA, RN made aware. Pt will benefit from skilled PT to increase their independence and safety with mobility to allow discharge to the venue listed below.      Follow Up Recommendations Outpatient PT    Equipment Recommendations  None recommended by PT    Recommendations for Other Services       Precautions / Restrictions Precautions Precautions: Fall;Other (comment) Precaution Comments: Monitor O2 Restrictions Weight Bearing Restrictions: No      Mobility  Bed Mobility Overal bed mobility: Independent             General bed mobility comments: No cues or physical assist needed  Transfers Overall transfer level: Needs assistance Equipment used: None Transfers: Sit to/from Stand Sit to Stand: Supervision         General transfer comment: Supervision for safety standing from bed as it was pt's first time OOB since admission  Ambulation/Gait Ambulation/Gait assistance: Supervision Ambulation Distance (Feet): 500 Feet Assistive device: Straight cane Gait Pattern/deviations: Step-through pattern;Drifts right/left   Gait velocity interpretation: at or above normal speed for age/gender General Gait Details: Mildly unsteady with challenges to balance.  Pt reports SOB at end of ambulation.   SpO2 down to 89% on RA.  Stairs            Wheelchair Mobility    Modified Rankin (Stroke Patients Only)       Balance Overall balance assessment: Needs assistance Sitting-balance support: No upper extremity supported;Feet supported Sitting balance-Leahy Scale: Normal     Standing balance support: No upper extremity supported;During functional activity Standing balance-Leahy Scale: Good               High level balance activites: Direction changes;Turns;Sudden stops;Head turns High Level Balance Comments: Mild instability with high level balance activities but no LOB Standardized Balance Assessment Standardized Balance Assessment : Berg Balance Test Berg Balance Test Sit to Stand: Able to stand without using hands and stabilize independently Standing Unsupported: Able to stand safely 2 minutes Sitting with Back Unsupported but Feet Supported on Floor or Stool: Able to sit safely and securely 2 minutes Stand to Sit: Sits safely with minimal use of hands Transfers: Able to transfer safely, minor use of hands Standing Unsupported with Eyes Closed: Able to stand 10 seconds with supervision Standing Ubsupported with Feet Together: Able to place feet together independently and stand for 1 minute with supervision From Standing, Reach Forward with Outstretched Arm: Can reach confidently >25 cm (10") From Standing Position, Pick up Object from Floor: Able to pick up shoe safely and easily From Standing Position, Turn to Look Behind Over each Shoulder: Looks behind one side only/other side shows less weight shift Turn 360 Degrees: Able to turn 360 degrees safely in 4 seconds  or less Standing Unsupported, Alternately Place Feet on Step/Stool: Able to stand independently and safely and complete 8 steps in 20 seconds Standing Unsupported, One Foot in Front: Loses balance while stepping or standing Standing on One Leg: Able to lift leg independently and hold equal to or more than  3 seconds Total Score: 47         Pertinent Vitals/Pain Pain Assessment: No/denies pain    Home Living Family/patient expects to be discharged to:: Private residence Living Arrangements: Children;Other (Comment) (Grandchild) Available Help at Discharge: Family;Available PRN/intermittently (Daughter works during the day) Type of Home: House Home Access: Stairs to enter Entrance Stairs-Rails: Right Entrance Stairs-Number of Steps: 3 Home Layout: One level Home Equipment: Shower seat;Cane - single point;Crutches      Prior Function Level of Independence: Independent with assistive device(s)         Comments: Pt does the cooking, cleaning.  Pt was using a cane when she left the house but over the past 3 or 4 weeks she was using it in the home as well.  Denies any falls over the past 6 months.  Pt still driving.     Hand Dominance   Dominant Hand: Right    Extremity/Trunk Assessment   Upper Extremity Assessment: Overall WFL for tasks assessed           Lower Extremity Assessment: RLE deficits/detail;LLE deficits/detail RLE Deficits / Details: strength grossly 4/5 LLE Deficits / Details: strength grossly 4/5  Cervical / Trunk Assessment: Normal  Communication   Communication: No difficulties  Cognition Arousal/Alertness: Awake/alert Behavior During Therapy: WFL for tasks assessed/performed Overall Cognitive Status: Within Functional Limits for tasks assessed                      General Comments General comments (skin integrity, edema, etc.): Pt scored 47/56 on Berg indicating pt is at a risk for falling    Exercises General Exercises - Lower Extremity Ankle Circles/Pumps: AROM;Both;15 reps;Supine Long Arc Quad: AROM;Both;10 reps;Seated Other Exercises Other Exercises: Single leg stance x30 seconds on each LE Other Exercises: Tandem stance x30 seconds x2, alternating foot placement   Assessment/Plan    PT Assessment Patient needs continued PT  services  PT Problem List Decreased strength;Decreased activity tolerance;Decreased balance;Decreased safety awareness;Cardiopulmonary status limiting activity          PT Treatment Interventions DME instruction;Gait training;Stair training;Functional mobility training;Therapeutic activities;Therapeutic exercise;Balance training;Patient/family education    PT Goals (Current goals can be found in the Care Plan section)  Acute Rehab PT Goals Patient Stated Goal: to be able to walk without fatigue or SOB PT Goal Formulation: With patient Time For Goal Achievement: 03/25/16 Potential to Achieve Goals: Good    Frequency Min 2X/week   Barriers to discharge        Co-evaluation               End of Session Equipment Utilized During Treatment: Gait belt Activity Tolerance: Patient tolerated treatment well;Patient limited by fatigue;Other (comment) (SOB while ambulating) Patient left: in chair;with call bell/phone within reach;with chair alarm set Nurse Communication: Mobility status;Other (comment) (SpO2)         Time: 1610-9604 PT Time Calculation (min) (ACUTE ONLY): 28 min   Charges:   PT Evaluation $PT Eval Low Complexity: 1 Procedure PT Treatments $Gait Training: 8-22 mins $Therapeutic Exercise: 8-22 mins   PT G Codes:        Collie Siad PT, DPT 03/18/2016, 10:19 AM

## 2016-03-18 NOTE — Care Management Important Message (Signed)
Important Message  Patient Details  Name: BERETTA GINSBERG MRN: [id number] Date of Birth: [birth year]   Medicare Important Message Given:  Yes    Shelbie Ammons, RN 03/18/2016, 10:01 AM

## 2016-03-18 NOTE — Progress Notes (Signed)
Initial Nutrition Assessment  DOCUMENTATION CODES:   Severe malnutrition in context of acute illness/injury  INTERVENTION:  Ordered Ensure Enlive po BID, each supplement provides 350 kcal and 20 grams of protein.  Reviewed menu with patient. Encouraged adequate intake of protein and calories with meals.  NUTRITION DIAGNOSIS:   Inadequate oral intake related to poor appetite, other (see comment) (early satiety) as evidenced by meal completion < 50%, per patient/family report, 7 percent weight loss over 1 month.  GOAL:   Patient will meet greater than or equal to 90% of their needs  MONITOR:   PO intake, Supplement acceptance, Weight trends, Labs, I & O's  REASON FOR ASSESSMENT:   Malnutrition Screening Tool    ASSESSMENT:   82 year old female with known history of essential hypertension, obstructive sleep apnea who presents with weakness (progressively worsening over 1 month).   Patient reports her appetite has been poor for the past 3-4 weeks since she has been very weak. She is experiencing anorexia (lack of hunger) and early satiety. She was still attempting to eat 3 meals and 2 snacks per day, but was only able to finish about 50% or less of meals. Reports UBW of 168 lbs and that she has noticed weight loss over this time period. This is 12 lbs weight loss (7% body weight) over 1 month, which is significant for time frame.  Patient is amenable to drinking Ensure BID between meals to help meet needs. Although she normally eats snacks at home, she would prefer to just do Ensure while she is here.  Medications reviewed and include: pantoprazole.  Labs reviewed: Glucose 105.  Nutrition-Focused physical exam completed. Findings are no fat depletion, no muscle depletion, and no edema. RD noticed some bruising on patient's legs. She reports this is from falls since she has been so weak lately.  Patient meets criteria for severe acute malnutrition in setting of 7% weight loss  over 1 month, intake </=50% of estimated energy requirement for >/=5 days.  Discussed with RN.   Diet Order:  Diet regular Room service appropriate? Yes; Fluid consistency: Thin  Skin:  Reviewed, no issues  Last BM:  03/17/2016  Height:   Ht Readings from Last 1 Encounters:  03/17/16 '5\' 2"'$  (1.575 m)    Weight:   Wt Readings from Last 1 Encounters:  03/17/16 156 lb 12.8 oz (71.1 kg)    Ideal Body Weight:  50 kg  BMI:  Body mass index is 28.68 kg/m.  Estimated Nutritional Needs:   Kcal:  1330-1445 (MSJ x 1.2-1.3)  Protein:  85-92 grams (1.2-1.3 grams/kg)  Fluid:  >/= 1.3 L/day  EDUCATION NEEDS:   Education needs addressed (Encouraged adequate intake of protein and calories with meals.)  Willey Blade, MS, RD, LDN Pager: [phone] After Hours Pager: [phone]

## 2016-03-19 LAB — TYPE AND SCREEN
ABO/RH(D): O POS
Antibody Screen: NEGATIVE
Unit division: 0
Unit division: 0

## 2016-04-01 ENCOUNTER — Encounter: Payer: Self-pay | Admitting: Oncology

## 2016-04-01 ENCOUNTER — Inpatient Hospital Stay: Payer: Medicare Other | Attending: Oncology | Admitting: Oncology

## 2016-04-01 ENCOUNTER — Inpatient Hospital Stay: Payer: Medicare Other

## 2016-04-01 DIAGNOSIS — Z79899 Other long term (current) drug therapy: Secondary | ICD-10-CM | POA: Diagnosis not present

## 2016-04-01 DIAGNOSIS — D509 Iron deficiency anemia, unspecified: Secondary | ICD-10-CM

## 2016-04-01 DIAGNOSIS — D500 Iron deficiency anemia secondary to blood loss (chronic): Secondary | ICD-10-CM | POA: Insufficient documentation

## 2016-04-01 DIAGNOSIS — G4730 Sleep apnea, unspecified: Secondary | ICD-10-CM | POA: Diagnosis not present

## 2016-04-01 DIAGNOSIS — I10 Essential (primary) hypertension: Secondary | ICD-10-CM

## 2016-04-01 DIAGNOSIS — Z7982 Long term (current) use of aspirin: Secondary | ICD-10-CM

## 2016-04-01 DIAGNOSIS — Z8541 Personal history of malignant neoplasm of cervix uteri: Secondary | ICD-10-CM

## 2016-04-01 DIAGNOSIS — M129 Arthropathy, unspecified: Secondary | ICD-10-CM

## 2016-04-01 DIAGNOSIS — K219 Gastro-esophageal reflux disease without esophagitis: Secondary | ICD-10-CM

## 2016-04-01 DIAGNOSIS — Z87891 Personal history of nicotine dependence: Secondary | ICD-10-CM | POA: Diagnosis not present

## 2016-04-01 LAB — CBC WITH DIFFERENTIAL/PLATELET
Basophils Absolute: 0.2 10*3/uL — ABNORMAL HIGH (ref 0–0.1)
Basophils Relative: 2 %
Eosinophils Absolute: 0.3 10*3/uL (ref 0–0.7)
Eosinophils Relative: 4 %
HCT: 33.3 % — ABNORMAL LOW (ref 35.0–47.0)
Hemoglobin: 10.3 g/dL — ABNORMAL LOW (ref 12.0–16.0)
Lymphocytes Relative: 20 %
Lymphs Abs: 1.6 10*3/uL (ref 1.0–3.6)
MCH: 22.1 pg — ABNORMAL LOW (ref 26.0–34.0)
MCHC: 31 g/dL — ABNORMAL LOW (ref 32.0–36.0)
MCV: 71.3 fL — ABNORMAL LOW (ref 80.0–100.0)
Monocytes Absolute: 1.4 10*3/uL — ABNORMAL HIGH (ref 0.2–0.9)
Monocytes Relative: 18 %
Neutro Abs: 4.5 10*3/uL (ref 1.4–6.5)
Neutrophils Relative %: 56 %
Platelets: 179 10*3/uL (ref 150–440)
RBC: 4.67 MIL/uL (ref 3.80–5.20)
RDW: 33 % — ABNORMAL HIGH (ref 11.5–14.5)
WBC: 7.9 10*3/uL (ref 3.6–11.0)

## 2016-04-01 LAB — FERRITIN: Ferritin: 27 ng/mL (ref 11–307)

## 2016-04-01 LAB — IRON AND TIBC
Iron: 279 ug/dL — ABNORMAL HIGH (ref 28–170)
Saturation Ratios: 56 % — ABNORMAL HIGH (ref 10.4–31.8)
TIBC: 495 ug/dL — ABNORMAL HIGH (ref 250–450)
UIBC: 216 ug/dL

## 2016-04-01 NOTE — Progress Notes (Signed)
Hospital follow up regarding anemia. Offers no complaints.

## 2016-04-01 NOTE — Progress Notes (Signed)
Kim Newman  [phone] [phone]) [phone]  ID: SAMA ARAUZ OB: [birth year]  MR#: [id number]  WUX#:[id number]  Patient Care Team: Madelyn Brunner, MD as PCP - General (Internal Medicine)  CHIEF COMPLAINT: Iron deficiency anemia.  INTERVAL HISTORY: Patient is an 82 year old female who was initially evaluated in the hospital for severe iron deficiency anemia. Patient received several units of packed red blood cells with significant improvement of her symptoms. No obvious etiology of her anemia was determined. Currently, she feels well and is asymptomatic. She does not complain of weakness or fatigue today. She has no neurologic complaints. She denies any recent fevers or illnesses. She has a good appetite and denies weight loss. She has no chest pain or shortness of breath. She denies any nausea, vomiting, constipation, or diarrhea. She has no melena or hematochezia. She has no urinary complaints. Patient feels at her baseline and offers no specific complaints today.  REVIEW OF SYSTEMS:   Review of Systems  Constitutional: Negative.  Negative for fever, malaise/fatigue and weight loss.  Respiratory: Negative.  Negative for cough and shortness of breath.   Cardiovascular: Negative.  Negative for chest pain and leg swelling.  Gastrointestinal: Negative.  Negative for abdominal pain, blood in stool and melena.  Genitourinary: Negative.   Musculoskeletal: Negative.   Neurological: Negative.  Negative for weakness.  Endo/Heme/Allergies: Negative.  Does not bruise/bleed easily.  Psychiatric/Behavioral: Negative.  The patient is not nervous/anxious.     As per HPI. Otherwise, a complete review of systems is negative.  PAST MEDICAL HISTORY: Past Medical History:  Diagnosis Date  . Arthritis   . Cataract   . Cervical cancer ([place]) 1997  . GERD (gastroesophageal reflux disease)   . Hypertension   . Macular degeneration   . Obstructive sleep apnea   . Sleep  apnea     PAST SURGICAL HISTORY: Past Surgical History:  Procedure Laterality Date  . ABDOMINAL HYSTERECTOMY    . BREAST BIOPSY     core 2002  . COLONOSCOPY  2012    FAMILY HISTORY: Family History  Problem Relation Age of Onset  . Breast cancer Neg Hx     ADVANCED DIRECTIVES (Y/N):  N  HEALTH MAINTENANCE: Social History  Substance Use Topics  . Smoking status: Former Research scientist (life sciences)  . Smokeless tobacco: Never Used  . Alcohol use Yes     Colonoscopy:  PAP:  Bone density:  Lipid panel:  Allergies  Allergen Reactions  . Statins   . Strawberry (Diagnostic)     Current Outpatient Prescriptions  Medication Sig Dispense Refill  . ADVAIR DISKUS 100-50 MCG/DOSE AEPB Inhale 2 puffs into the lungs 2 (two) times daily.     Marland Kitchen aspirin EC 81 MG tablet Take 1 tablet by mouth 2 (two) times daily.     . Calcium Carbonate-Vit D-Min (CALCIUM 1200 PO) Take 1 tablet by mouth 2 (two) times daily.    . cetirizine (ZYRTEC) 10 MG tablet Take 1 tablet by mouth daily.    . Cyanocobalamin (RA VITAMIN B-12 TR) 1000 MCG TBCR Take 2 tablets by mouth daily.    Marland Kitchen DYMISTA 137-50 MCG/ACT SUSP Place 1 spray into the nose 2 (two) times daily.     . ferrous sulfate 325 (65 FE) MG tablet Take 1 tablet (325 mg total) by mouth 2 (two) times daily with a meal. 60 tablet 3  . losartan (COZAAR) 50 MG tablet Take 1 tablet by mouth daily.    . Multiple Vitamin (MULTIVITAMIN) tablet  Take 1 tablet by mouth daily.    . Multiple Vitamins-Minerals (PRESERVISION AREDS 2 PO) Take 1 tablet by mouth 2 (two) times daily.     . Omega-3 Fatty Acids (FISH OIL PO) Take 1 capsule by mouth daily.    Marland Kitchen omeprazole (PRILOSEC) 40 MG capsule Take 1 capsule by mouth daily.    . vitamin E (VITAMIN E) 400 UNIT capsule Take 400 Units by mouth daily.     No current facility-administered medications for this visit.     OBJECTIVE: Vitals:   04/01/16 1124  BP: (!) 141/72  Pulse: 77  Resp: 18  Temp: 98.2 F (36.8 C)     Body mass  index is 28.19 kg/m.    ECOG FS:0 - Asymptomatic  General: Well-developed, well-nourished, no acute distress. Eyes: Pink conjunctiva, anicteric sclera. HEENT: Normocephalic, moist mucous membranes, clear oropharnyx. Lungs: Clear to auscultation bilaterally. Heart: Regular rate and rhythm. No rubs, murmurs, or gallops. Abdomen: Soft, nontender, nondistended. No organomegaly noted, normoactive bowel sounds. Musculoskeletal: No edema, cyanosis, or clubbing. Neuro: Alert, answering all questions appropriately. Cranial nerves grossly intact. Skin: No rashes or petechiae noted. Psych: Normal affect. Lymphatics: No cervical, calvicular, axillary or inguinal LAD.   LAB RESULTS:  Lab Results  Component Value Date   NA 136 03/18/2016   K 4.2 03/18/2016   CL 105 03/18/2016   CO2 25 03/18/2016   GLUCOSE 105 (H) 03/18/2016   BUN 19 03/18/2016   CREATININE 0.79 03/18/2016   CALCIUM 8.7 (L) 03/18/2016   PROT 7.5 03/17/2016   ALBUMIN 3.8 03/17/2016   AST 41 03/17/2016   ALT 25 03/17/2016   ALKPHOS 74 03/17/2016   BILITOT 0.2 (L) 03/17/2016   GFRNONAA >60 03/18/2016   GFRAA >60 03/18/2016    Lab Results  Component Value Date   WBC 7.9 04/01/2016   NEUTROABS 4.5 04/01/2016   HGB 10.3 (L) 04/01/2016   HCT 33.3 (L) 04/01/2016   MCV 71.3 (L) 04/01/2016   PLT 179 04/01/2016   Lab Results  Component Value Date   IRON 279 (H) 04/01/2016   TIBC 495 (H) 04/01/2016   IRONPCTSAT 56 (H) 04/01/2016   Lab Results  Component Value Date   FERRITIN 27 04/01/2016     STUDIES: No results found.  ASSESSMENT: Iron deficiency anemia.  PLAN:    1. Iron deficiency anemia:Patient's hemoglobin is decreased, but significantly improved since her discharge. Iron stores are now within normal limits. No obvious etiology was never determined. Patient was not evaluated by GI in the hospital. No intervention is needed. Return to clinic in 3 months with repeat laboratory work and further  evaluation.  Approximately 30 minutes was spent in discussion of which greater than 50% was consultation.  Patient expressed understanding and was in agreement with this plan. She also understands that She can call clinic at any time with any questions, concerns, or complaints.    Lloyd Huger, MD   04/01/2016 3:09 PM

## 2016-06-16 ENCOUNTER — Other Ambulatory Visit: Payer: Self-pay | Admitting: Internal Medicine

## 2016-06-16 DIAGNOSIS — Z1231 Encounter for screening mammogram for malignant neoplasm of breast: Secondary | ICD-10-CM

## 2016-07-02 ENCOUNTER — Other Ambulatory Visit: Payer: Medicare Other

## 2016-07-06 ENCOUNTER — Inpatient Hospital Stay: Payer: Medicare Other | Attending: Oncology

## 2016-07-06 DIAGNOSIS — G4730 Sleep apnea, unspecified: Secondary | ICD-10-CM | POA: Insufficient documentation

## 2016-07-06 DIAGNOSIS — M129 Arthropathy, unspecified: Secondary | ICD-10-CM | POA: Diagnosis not present

## 2016-07-06 DIAGNOSIS — K219 Gastro-esophageal reflux disease without esophagitis: Secondary | ICD-10-CM | POA: Diagnosis not present

## 2016-07-06 DIAGNOSIS — Z7982 Long term (current) use of aspirin: Secondary | ICD-10-CM | POA: Diagnosis not present

## 2016-07-06 DIAGNOSIS — Z87891 Personal history of nicotine dependence: Secondary | ICD-10-CM | POA: Diagnosis not present

## 2016-07-06 DIAGNOSIS — I10 Essential (primary) hypertension: Secondary | ICD-10-CM | POA: Insufficient documentation

## 2016-07-06 DIAGNOSIS — D509 Iron deficiency anemia, unspecified: Secondary | ICD-10-CM | POA: Insufficient documentation

## 2016-07-06 DIAGNOSIS — Z8541 Personal history of malignant neoplasm of cervix uteri: Secondary | ICD-10-CM | POA: Diagnosis not present

## 2016-07-06 DIAGNOSIS — Z79899 Other long term (current) drug therapy: Secondary | ICD-10-CM | POA: Insufficient documentation

## 2016-07-06 DIAGNOSIS — D500 Iron deficiency anemia secondary to blood loss (chronic): Secondary | ICD-10-CM

## 2016-07-06 DIAGNOSIS — Z90722 Acquired absence of ovaries, bilateral: Secondary | ICD-10-CM | POA: Diagnosis not present

## 2016-07-06 LAB — IRON AND TIBC
Iron: 56 ug/dL (ref 28–170)
Saturation Ratios: 14 % (ref 10.4–31.8)
TIBC: 389 ug/dL (ref 250–450)
UIBC: 333 ug/dL

## 2016-07-06 LAB — CBC WITH DIFFERENTIAL/PLATELET
Basophils Absolute: 0.1 10*3/uL (ref 0–0.1)
Basophils Relative: 1 %
Eosinophils Absolute: 0.2 10*3/uL (ref 0–0.7)
Eosinophils Relative: 3 %
HCT: 42.9 % (ref 35.0–47.0)
Hemoglobin: 14.6 g/dL (ref 12.0–16.0)
Lymphocytes Relative: 16 %
Lymphs Abs: 1.4 10*3/uL (ref 1.0–3.6)
MCH: 27.3 pg (ref 26.0–34.0)
MCHC: 34.1 g/dL (ref 32.0–36.0)
MCV: 80 fL (ref 80.0–100.0)
Monocytes Absolute: 1.2 10*3/uL — ABNORMAL HIGH (ref 0.2–0.9)
Monocytes Relative: 14 %
Neutro Abs: 5.9 10*3/uL (ref 1.4–6.5)
Neutrophils Relative %: 66 %
Platelets: 199 10*3/uL (ref 150–440)
RBC: 5.36 MIL/uL — ABNORMAL HIGH (ref 3.80–5.20)
RDW: 14.8 % — ABNORMAL HIGH (ref 11.5–14.5)
WBC: 8.9 10*3/uL (ref 3.6–11.0)

## 2016-07-06 LAB — FERRITIN: Ferritin: 28 ng/mL (ref 11–307)

## 2016-07-07 ENCOUNTER — Other Ambulatory Visit: Payer: Self-pay | Admitting: Oncology

## 2016-07-07 NOTE — Progress Notes (Signed)
[name]  Telephone:(336) [phone] [phone]) [phone]  ID: Kim Newman OB: 02/08/1928  MR#: [id number]  HYQ#:[id number]  Patient Care Team: Madelyn Brunner, MD as PCP - General (Internal Medicine)  CHIEF COMPLAINT: Iron deficiency anemia.  INTERVAL HISTORY: Patient returns to clinic today for repeat laboratory work and further evaluation. She continues to feel well and is asymptomatic. She does not complain of weakness or fatigue today. She has no neurologic complaints. She denies any recent fevers or illnesses. She has a good appetite and denies weight loss. She has no chest pain or shortness of breath. She denies any nausea, vomiting, constipation, or diarrhea. She has no melena or hematochezia. She has no urinary complaints. Patient feels at her baseline and offers no specific complaints today.  REVIEW OF SYSTEMS:   Review of Systems  Constitutional: Negative.  Negative for fever, malaise/fatigue and weight loss.  Respiratory: Negative.  Negative for cough and shortness of breath.   Cardiovascular: Negative.  Negative for chest pain and leg swelling.  Gastrointestinal: Negative.  Negative for abdominal pain, blood in stool and melena.  Genitourinary: Negative.   Musculoskeletal: Negative.   Neurological: Negative.  Negative for weakness.  Endo/Heme/Allergies: Negative.  Does not bruise/bleed easily.  Psychiatric/Behavioral: Negative.  The patient is not nervous/anxious.     As per HPI. Otherwise, a complete review of systems is negative.  PAST MEDICAL HISTORY: Past Medical History:  Diagnosis Date  . Arthritis   . Cataract   . Cervical cancer ([place]) 1997  . GERD (gastroesophageal reflux disease)   . Hypertension   . Macular degeneration   . Obstructive sleep apnea   . Sleep apnea     PAST SURGICAL HISTORY: Past Surgical History:  Procedure Laterality Date  . ABDOMINAL HYSTERECTOMY    . BREAST BIOPSY     core 2002  . COLONOSCOPY  2012     FAMILY HISTORY: Family History  Problem Relation Age of Onset  . Breast cancer Neg Hx     ADVANCED DIRECTIVES (Y/N):  N  HEALTH MAINTENANCE: Social History  Substance Use Topics  . Smoking status: Former Research scientist (life sciences)  . Smokeless tobacco: Never Used  . Alcohol use Yes     Colonoscopy:  PAP:  Bone density:  Lipid panel:  Allergies  Allergen Reactions  . Statins   . Strawberry (Diagnostic)     Current Outpatient Prescriptions  Medication Sig Dispense Refill  . ADVAIR DISKUS 100-50 MCG/DOSE AEPB Inhale 2 puffs into the lungs 2 (two) times daily.     Marland Kitchen aspirin EC 81 MG tablet Take 1 tablet by mouth 2 (two) times daily.     . Calcium Carbonate-Vit D-Min (CALCIUM 1200 PO) Take 1 tablet by mouth 2 (two) times daily.    . cetirizine (ZYRTEC) 10 MG tablet Take 1 tablet by mouth daily.    . Cyanocobalamin (RA VITAMIN B-12 TR) 1000 MCG TBCR Take 2 tablets by mouth daily.    Marland Kitchen DYMISTA 137-50 MCG/ACT SUSP Place 1 spray into the nose 2 (two) times daily.     . ferrous sulfate 325 (65 FE) MG tablet Take 1 tablet (325 mg total) by mouth 2 (two) times daily with a meal. 60 tablet 3  . losartan (COZAAR) 50 MG tablet Take 1 tablet by mouth daily.    . Multiple Vitamin (MULTIVITAMIN) tablet Take 1 tablet by mouth daily.    . Multiple Vitamins-Minerals (PRESERVISION AREDS 2 PO) Take 1 tablet by mouth 2 (two) times daily.     Marland Kitchen  Omega-3 Fatty Acids (FISH OIL PO) Take 1 capsule by mouth daily.    Marland Kitchen omeprazole (PRILOSEC) 40 MG capsule Take 1 capsule by mouth daily.    . vitamin E (VITAMIN E) 400 UNIT capsule Take 400 Units by mouth daily.     No current facility-administered medications for this visit.     OBJECTIVE: Vitals:   07/08/16 1414  BP: (!) 161/65  Pulse: 86  Resp: 18  Temp: 98.9 F (37.2 C)     Body mass index is 26.77 kg/m.    ECOG FS:0 - Asymptomatic  General: Well-developed, well-nourished, no acute distress. Eyes: Pink conjunctiva, anicteric sclera. Lungs: Clear to  auscultation bilaterally. Heart: Regular rate and rhythm. No rubs, murmurs, or gallops. Abdomen: Soft, nontender, nondistended. No organomegaly noted, normoactive bowel sounds. Musculoskeletal: No edema, cyanosis, or clubbing. Neuro: Alert, answering all questions appropriately. Cranial nerves grossly intact. Skin: No rashes or petechiae noted. Psych: Normal affect.  LAB RESULTS:  Lab Results  Component Value Date   NA 136 03/18/2016   K 4.2 03/18/2016   CL 105 03/18/2016   CO2 25 03/18/2016   GLUCOSE 105 (H) 03/18/2016   BUN 19 03/18/2016   CREATININE 0.79 03/18/2016   CALCIUM 8.7 (L) 03/18/2016   PROT 7.5 03/17/2016   ALBUMIN 3.8 03/17/2016   AST 41 03/17/2016   ALT 25 03/17/2016   ALKPHOS 74 03/17/2016   BILITOT 0.2 (L) 03/17/2016   GFRNONAA >60 03/18/2016   GFRAA >60 03/18/2016    Lab Results  Component Value Date   WBC 8.9 07/06/2016   NEUTROABS 5.9 07/06/2016   HGB 14.6 07/06/2016   HCT 42.9 07/06/2016   MCV 80.0 07/06/2016   PLT 199 07/06/2016   Lab Results  Component Value Date   IRON 56 07/06/2016   TIBC 389 07/06/2016   IRONPCTSAT 14 07/06/2016   Lab Results  Component Value Date   FERRITIN 28 07/06/2016     STUDIES: No results found.  ASSESSMENT: Iron deficiency anemia.  PLAN:    1. Iron deficiency anemia: Patient's hemoglobin and iron stores continue to be within normal limits. No obvious etiology was never determined. Patient was not evaluated by GI in the hospital. Proceed, all of her other laboratory work is either negative or within normal limits. No further intervention is needed. After lengthy discussion with the patient, it was determined that no further follow-up is necessary. Please continue to monitor patient's hemoglobin and if she begins to trend down or become symptomatic please refer her back for further evaluation.    Approximately 20 minutes was spent in discussion of which greater than 50% was consultation.  Patient expressed  understanding and was in agreement with this plan. She also understands that She can call clinic at any time with any questions, concerns, or complaints.    Lloyd Huger, MD   07/08/2016 2:21 PM

## 2016-07-08 ENCOUNTER — Inpatient Hospital Stay (HOSPITAL_BASED_OUTPATIENT_CLINIC_OR_DEPARTMENT_OTHER): Payer: Medicare Other | Admitting: Oncology

## 2016-07-08 ENCOUNTER — Inpatient Hospital Stay: Payer: Medicare Other

## 2016-07-08 VITALS — BP 161/65 | HR 86 | Temp 98.9°F | Resp 18 | Wt 146.4 lb

## 2016-07-08 DIAGNOSIS — M129 Arthropathy, unspecified: Secondary | ICD-10-CM | POA: Diagnosis not present

## 2016-07-08 DIAGNOSIS — D500 Iron deficiency anemia secondary to blood loss (chronic): Secondary | ICD-10-CM

## 2016-07-08 DIAGNOSIS — Z7982 Long term (current) use of aspirin: Secondary | ICD-10-CM

## 2016-07-08 DIAGNOSIS — D509 Iron deficiency anemia, unspecified: Secondary | ICD-10-CM

## 2016-07-08 DIAGNOSIS — I10 Essential (primary) hypertension: Secondary | ICD-10-CM

## 2016-07-08 DIAGNOSIS — Z87891 Personal history of nicotine dependence: Secondary | ICD-10-CM

## 2016-07-08 DIAGNOSIS — G4730 Sleep apnea, unspecified: Secondary | ICD-10-CM

## 2016-07-08 DIAGNOSIS — Z90722 Acquired absence of ovaries, bilateral: Secondary | ICD-10-CM

## 2016-07-08 DIAGNOSIS — K219 Gastro-esophageal reflux disease without esophagitis: Secondary | ICD-10-CM

## 2016-07-08 DIAGNOSIS — Z79899 Other long term (current) drug therapy: Secondary | ICD-10-CM

## 2016-07-08 DIAGNOSIS — Z8541 Personal history of malignant neoplasm of cervix uteri: Secondary | ICD-10-CM

## 2016-07-08 NOTE — Progress Notes (Signed)
Patient is here for follow up, she is doing very well no complaints

## 2016-07-14 ENCOUNTER — Ambulatory Visit
Admission: RE | Admit: 2016-07-14 | Discharge: 2016-07-14 | Disposition: A | Discharge status: Home / Self Care | Payer: Medicare Other | Source: Ambulatory Visit | Attending: Internal Medicine | Admitting: Internal Medicine

## 2016-07-14 DIAGNOSIS — Z1231 Encounter for screening mammogram for malignant neoplasm of breast: Secondary | ICD-10-CM | POA: Insufficient documentation

## 2017-12-13 ENCOUNTER — Other Ambulatory Visit: Payer: Self-pay | Admitting: Internal Medicine

## 2017-12-13 DIAGNOSIS — Z1231 Encounter for screening mammogram for malignant neoplasm of breast: Secondary | ICD-10-CM

## 2017-12-29 ENCOUNTER — Ambulatory Visit
Admission: RE | Admit: 2017-12-29 | Discharge: 2017-12-29 | Disposition: A | Discharge status: Home / Self Care | Payer: Medicare Other | Source: Ambulatory Visit | Attending: Internal Medicine | Admitting: Internal Medicine

## 2017-12-29 DIAGNOSIS — Z1231 Encounter for screening mammogram for malignant neoplasm of breast: Secondary | ICD-10-CM | POA: Diagnosis not present

## 2018-03-17 ENCOUNTER — Other Ambulatory Visit: Payer: Self-pay | Admitting: Specialist

## 2018-03-17 DIAGNOSIS — J849 Interstitial pulmonary disease, unspecified: Secondary | ICD-10-CM

## 2018-03-22 ENCOUNTER — Ambulatory Visit
Admission: RE | Admit: 2018-03-22 | Discharge: 2018-03-22 | Disposition: A | Discharge status: Home / Self Care | Payer: Medicare Other | Source: Ambulatory Visit | Attending: Specialist | Admitting: Specialist

## 2018-03-22 DIAGNOSIS — I2510 Atherosclerotic heart disease of native coronary artery without angina pectoris: Secondary | ICD-10-CM | POA: Insufficient documentation

## 2018-03-22 DIAGNOSIS — J849 Interstitial pulmonary disease, unspecified: Secondary | ICD-10-CM

## 2018-03-22 DIAGNOSIS — I700 Atherosclerosis of aorta: Secondary | ICD-10-CM | POA: Insufficient documentation

## 2018-03-22 DIAGNOSIS — R590 Localized enlarged lymph nodes: Secondary | ICD-10-CM | POA: Insufficient documentation

## 2018-03-22 DIAGNOSIS — K449 Diaphragmatic hernia without obstruction or gangrene: Secondary | ICD-10-CM | POA: Diagnosis not present

## 2018-03-22 DIAGNOSIS — Z87891 Personal history of nicotine dependence: Secondary | ICD-10-CM | POA: Insufficient documentation

## 2018-11-23 ENCOUNTER — Other Ambulatory Visit: Payer: Self-pay | Admitting: Internal Medicine

## 2018-11-23 DIAGNOSIS — Z1231 Encounter for screening mammogram for malignant neoplasm of breast: Secondary | ICD-10-CM

## 2019-01-02 ENCOUNTER — Ambulatory Visit
Admission: RE | Admit: 2019-01-02 | Discharge: 2019-01-02 | Disposition: A | Discharge status: Home / Self Care | Payer: Medicare Other | Source: Ambulatory Visit | Attending: Internal Medicine | Admitting: Internal Medicine

## 2019-01-02 DIAGNOSIS — Z1231 Encounter for screening mammogram for malignant neoplasm of breast: Secondary | ICD-10-CM | POA: Diagnosis present

## 2019-05-11 ENCOUNTER — Other Ambulatory Visit: Payer: Self-pay | Admitting: Specialist

## 2019-05-11 DIAGNOSIS — R918 Other nonspecific abnormal finding of lung field: Secondary | ICD-10-CM

## 2019-05-24 ENCOUNTER — Other Ambulatory Visit: Payer: Self-pay

## 2019-05-24 ENCOUNTER — Ambulatory Visit
Admission: RE | Admit: 2019-05-24 | Discharge: 2019-05-24 | Disposition: A | Discharge status: Home / Self Care | Payer: Medicare Other | Source: Ambulatory Visit | Attending: Specialist | Admitting: Specialist

## 2019-05-24 DIAGNOSIS — R918 Other nonspecific abnormal finding of lung field: Secondary | ICD-10-CM | POA: Insufficient documentation

## 2019-12-08 ENCOUNTER — Other Ambulatory Visit: Payer: Self-pay | Admitting: Internal Medicine

## 2019-12-08 DIAGNOSIS — Z1231 Encounter for screening mammogram for malignant neoplasm of breast: Secondary | ICD-10-CM

## 2020-01-03 ENCOUNTER — Other Ambulatory Visit: Payer: Self-pay

## 2020-01-03 ENCOUNTER — Ambulatory Visit
Admission: RE | Admit: 2020-01-03 | Discharge: 2020-01-03 | Disposition: A | Discharge status: Home / Self Care | Payer: Medicare PPO | Source: Ambulatory Visit | Attending: Internal Medicine | Admitting: Internal Medicine

## 2020-01-03 DIAGNOSIS — Z1231 Encounter for screening mammogram for malignant neoplasm of breast: Secondary | ICD-10-CM | POA: Diagnosis not present

## 2020-04-02 ENCOUNTER — Other Ambulatory Visit (HOSPITAL_COMMUNITY): Payer: Self-pay | Admitting: Family Medicine

## 2020-04-02 ENCOUNTER — Other Ambulatory Visit: Payer: Self-pay | Admitting: Family Medicine

## 2020-04-02 DIAGNOSIS — M5412 Radiculopathy, cervical region: Secondary | ICD-10-CM

## 2020-04-16 ENCOUNTER — Ambulatory Visit
Admission: RE | Admit: 2020-04-16 | Discharge: 2020-04-16 | Disposition: A | Discharge status: Home / Self Care | Payer: Medicare PPO | Source: Ambulatory Visit | Attending: Family Medicine | Admitting: Family Medicine

## 2020-04-16 ENCOUNTER — Other Ambulatory Visit: Payer: Self-pay

## 2020-04-16 DIAGNOSIS — M5412 Radiculopathy, cervical region: Secondary | ICD-10-CM | POA: Insufficient documentation

## 2020-04-17 ENCOUNTER — Other Ambulatory Visit: Payer: Self-pay | Admitting: Physical Medicine and Rehabilitation

## 2020-04-17 DIAGNOSIS — G540 Brachial plexus disorders: Secondary | ICD-10-CM

## 2020-04-17 DIAGNOSIS — C801 Malignant (primary) neoplasm, unspecified: Secondary | ICD-10-CM

## 2020-04-24 ENCOUNTER — Other Ambulatory Visit: Payer: Self-pay | Admitting: Specialist

## 2020-04-24 DIAGNOSIS — J479 Bronchiectasis, uncomplicated: Secondary | ICD-10-CM

## 2020-04-24 DIAGNOSIS — R911 Solitary pulmonary nodule: Secondary | ICD-10-CM

## 2020-05-01 ENCOUNTER — Ambulatory Visit
Admission: RE | Admit: 2020-05-01 | Discharge: 2020-05-01 | Disposition: A | Discharge status: Home / Self Care | Payer: Medicare PPO | Source: Ambulatory Visit | Attending: Physical Medicine and Rehabilitation | Admitting: Physical Medicine and Rehabilitation

## 2020-05-01 ENCOUNTER — Other Ambulatory Visit: Payer: Self-pay

## 2020-05-01 DIAGNOSIS — G540 Brachial plexus disorders: Secondary | ICD-10-CM | POA: Insufficient documentation

## 2020-05-01 DIAGNOSIS — C801 Malignant (primary) neoplasm, unspecified: Secondary | ICD-10-CM | POA: Diagnosis present

## 2020-05-01 MED ORDER — GADOBUTROL 1 MMOL/ML IV SOLN
6.0000 mL | Freq: Once | INTRAVENOUS | Status: AC | PRN
  Administered 2020-05-01: 6 mL via INTRAVENOUS

## 2020-05-02 ENCOUNTER — Ambulatory Visit
Admission: RE | Admit: 2020-05-02 | Discharge: 2020-05-02 | Disposition: A | Discharge status: Home / Self Care | Payer: Medicare PPO | Source: Ambulatory Visit | Attending: Specialist | Admitting: Specialist

## 2020-05-02 DIAGNOSIS — J479 Bronchiectasis, uncomplicated: Secondary | ICD-10-CM | POA: Diagnosis present

## 2020-05-02 DIAGNOSIS — R911 Solitary pulmonary nodule: Secondary | ICD-10-CM | POA: Insufficient documentation

## 2020-05-09 ENCOUNTER — Ambulatory Visit
Admission: RE | Admit: 2020-05-09 | Discharge: 2020-05-09 | Disposition: A | Discharge status: Home / Self Care | Payer: Medicare PPO | Source: Ambulatory Visit | Attending: Radiation Oncology | Admitting: Radiation Oncology

## 2020-05-09 ENCOUNTER — Other Ambulatory Visit: Payer: Self-pay | Admitting: *Deleted

## 2020-05-09 ENCOUNTER — Encounter: Payer: Self-pay | Admitting: *Deleted

## 2020-05-09 ENCOUNTER — Ambulatory Visit: Payer: Medicare PPO

## 2020-05-09 VITALS — BP 131/118 | HR 82 | Temp 97.3°F | Wt 149.0 lb

## 2020-05-09 DIAGNOSIS — M1990 Unspecified osteoarthritis, unspecified site: Secondary | ICD-10-CM | POA: Diagnosis not present

## 2020-05-09 DIAGNOSIS — I10 Essential (primary) hypertension: Secondary | ICD-10-CM | POA: Diagnosis not present

## 2020-05-09 DIAGNOSIS — Z8541 Personal history of malignant neoplasm of cervix uteri: Secondary | ICD-10-CM | POA: Insufficient documentation

## 2020-05-09 DIAGNOSIS — G4733 Obstructive sleep apnea (adult) (pediatric): Secondary | ICD-10-CM | POA: Diagnosis not present

## 2020-05-09 DIAGNOSIS — Z79899 Other long term (current) drug therapy: Secondary | ICD-10-CM | POA: Diagnosis not present

## 2020-05-09 DIAGNOSIS — M542 Cervicalgia: Secondary | ICD-10-CM | POA: Diagnosis not present

## 2020-05-09 DIAGNOSIS — K219 Gastro-esophageal reflux disease without esophagitis: Secondary | ICD-10-CM | POA: Insufficient documentation

## 2020-05-09 DIAGNOSIS — Z87891 Personal history of nicotine dependence: Secondary | ICD-10-CM | POA: Insufficient documentation

## 2020-05-09 DIAGNOSIS — R918 Other nonspecific abnormal finding of lung field: Secondary | ICD-10-CM | POA: Insufficient documentation

## 2020-05-09 DIAGNOSIS — C3490 Malignant neoplasm of unspecified part of unspecified bronchus or lung: Secondary | ICD-10-CM

## 2020-05-09 DIAGNOSIS — Z7982 Long term (current) use of aspirin: Secondary | ICD-10-CM | POA: Diagnosis not present

## 2020-05-09 NOTE — Consult Note (Signed)
NEW PATIENT EVALUATION  Name: Kim Newman  MRN: [id number]  Date:   05/09/2020     DOB: 1927/12/12   This 86 y.o. female patient presents to the clinic for initial evaluation of locally advanced lung cancer with involvement of cervical spine as yet not staged completely.  REFERRING PHYSICIAN: Baxter Hire, MD  CHIEF COMPLAINT:  Chief Complaint  Patient presents with  . Lung Cancer    INitial consultation    DIAGNOSIS: The encounter diagnosis was Mass of right lung.   PREVIOUS INVESTIGATIONS:  CT scans reviewed MRI scans reviewed Clinical notes reviewed  HPI: Patient is a 86 year old female who is been followed for neck pain.  She had been seen by a physiatrist at which time a MRI scan of her cervical spine was performed showing a right paraspinal soft tissue mass at the C7-T1 levels encasing the right first rib extending into the right C7-T1 neural foramina concerning for malignancy.  She then underwent an MRI scan of her chest showing a 7.1 cm mass involving the right lung apex with destructive findings of the right first rib right second rib right C7 transverse process and possibly T11 transverse process.  Mass extended around the left subclavian artery.  She is also noted to have a large right middle lobe mass with scattered pulmonary nodules favoring widespread metastatic disease.  This was confirmed on CT scan which also confirmed a 5.6 cavitary mass in the right middle lobe as well as scattered bilateral pulmonary nodules consistent with metastatic disease.  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  She is seen today for radiation oncology opinion.  She is a former patient of Dr. Gary Fleet who is seen her in the past for iron deficiency anemia.  PLANNED TREATMENT REGIMEN: Palliative radiation therapy to right chest after PET/CT  PAST MEDICAL HISTORY:  has a past medical history of Arthritis,  Cataract, Cervical cancer ([place]) (1997), GERD (gastroesophageal reflux disease), Hypertension, Macular degeneration, Obstructive sleep apnea, and Sleep apnea.    PAST SURGICAL HISTORY:  Past Surgical History:  Procedure Laterality Date  . ABDOMINAL HYSTERECTOMY  1997  . BREAST BIOPSY Left 2002   core   . COLONOSCOPY  2012    FAMILY HISTORY: family history is not on file.  SOCIAL HISTORY:  reports that she has quit smoking. She has never used smokeless tobacco. She reports current alcohol use. She reports that she does not use drugs.  ALLERGIES: Statins and Strawberry (diagnostic)  MEDICATIONS:  Current Outpatient Medications  Medication Sig Dispense Refill  . ADVAIR DISKUS 100-50 MCG/DOSE AEPB Inhale 2 puffs into the lungs 2 (two) times daily.     Marland Kitchen aspirin EC 81 MG tablet Take 1 tablet by mouth 2 (two) times daily.     . Calcium Carbonate-Vit D-Min (CALCIUM 1200 PO) Take 1 tablet by mouth 2 (two) times daily.    . cetirizine (ZYRTEC) 10 MG tablet Take 1 tablet by mouth daily.    . Cyanocobalamin 1000 MCG TBCR Take 2 tablets by mouth daily.    Marland Kitchen DYMISTA 137-50 MCG/ACT SUSP Place 1 spray into the nose 2 (two) times daily.     . ferrous sulfate 325 (65 FE) MG tablet Take 1 tablet (325 mg total) by mouth 2 (two) times daily with a meal. 60 tablet 3  . losartan (COZAAR) 50 MG tablet Take 1 tablet by mouth daily.    . Multiple Vitamin (MULTIVITAMIN) tablet  Take 1 tablet by mouth daily.    . Multiple Vitamins-Minerals (PRESERVISION AREDS 2 PO) Take 1 tablet by mouth 2 (two) times daily.     . Omega-3 Fatty Acids (FISH OIL PO) Take 1 capsule by mouth daily.    Marland Kitchen omeprazole (PRILOSEC) 40 MG capsule Take 1 capsule by mouth daily.    Marland Kitchen telmisartan (MICARDIS) 40 MG tablet     . traMADol (ULTRAM) 50 MG tablet Take by mouth.    . vitamin E 180 MG (400 UNITS) capsule Take 400 Units by mouth daily.     No current facility-administered medications for this encounter.    ECOG PERFORMANCE STATUS:   1 - Symptomatic but completely ambulatory  REVIEW OF SYSTEMS: Patient denies any weight loss, fatigue, weakness, fever, chills or night sweats. Patient denies any loss of vision, blurred vision. Patient denies any ringing  of the ears or hearing loss. No irregular heartbeat. Patient denies heart murmur or history of fainting. Patient denies any chest pain or pain radiating to her upper extremities. Patient denies any shortness of breath, difficulty breathing at night, cough or hemoptysis. Patient denies any swelling in the lower legs. Patient denies any nausea vomiting, vomiting of blood, or coffee ground material in the vomitus. Patient denies any stomach pain. Patient states has had normal bowel movements no significant constipation or diarrhea. Patient denies any dysuria, hematuria or significant nocturia. Patient denies any problems walking, swelling in the joints or loss of balance. Patient denies any skin changes, loss of hair or loss of weight. Patient denies any excessive worrying or anxiety or significant depression. Patient denies any problems with insomnia. Patient denies excessive thirst, polyuria, polydipsia. Patient denies any swollen glands, patient denies easy bruising or easy bleeding. Patient denies any recent infections, allergies or URI. Patient "s visual fields have not changed significantly in recent time.   PHYSICAL EXAM: BP (!) 131/118   Pulse 82   Temp (!) 97.3 F (36.3 C) (Tympanic)   Wt 149 lb (67.6 kg)   SpO2 94% Comment: room air  BMI 27.25 kg/m  Patient has decreased strength in the right upper extremity.  Range of motion of her neck does not elicit pain.  Well-developed well-nourished patient in NAD. HEENT reveals PERLA, EOMI, discs not visualized.  Oral cavity is clear. No oral mucosal lesions are identified. Neck is clear without evidence of cervical or supraclavicular adenopathy. Lungs are clear to A&P. Cardiac examination is essentially unremarkable with regular rate  and rhythm without murmur rub or thrill. Abdomen is benign with no organomegaly or masses noted. Motor sensory and DTR levels are equal and symmetric in the upper and lower extremities. Cranial nerves II through XII are grossly intact. Proprioception is intact. No peripheral adenopathy or edema is identified. No motor or sensory levels are noted. Crude visual fields are within normal range.  LABORATORY DATA: Labs reviewed compatible with above-stated findings    RADIOLOGY RESULTS: MRI and CT scans reviewed compatible with above-stated findings   IMPRESSION: Probable stage IV non-small cell lung cancer in 86 year old female  PLAN: This time of ordered a PET CT scan to completely stage the patient and determine extent of disease and targets for palliative radiation therapy.  I would plan on delivering 40 Gray over 4 weeks and evaluate for response.  I would target areas of rib destruction as well as cervical spine involvement.  Risks and benefits of treatment occluding possible radiation esophagitis and dysphagia fatigue skin reaction alteration of blood counts and development  of significant cough or were described in detail to the patient and her daughter.  They both seem to comprehend my treatment plan well.  I have ordered a PET CT scan and will see her for CT simulation shortly thereafter.  Also will arrange for her to revisit Dr. Grayland Ormond for his input.  I would like to take this opportunity to thank you for allowing me to participate in the care of your patient.Noreene Filbert, MD

## 2020-05-09 NOTE — Progress Notes (Signed)
  Oncology Nurse Navigator Documentation  Navigator Location: [facility]-Med Onc (05/09/20 1300) Referral Date to RadOnc/MedOnc: 05/07/20 (05/09/20 1300) )Navigator Encounter Type: Initial RadOnc (05/09/20 1300)   Abnormal Finding Date: 05/02/20 (05/09/20 1300)                   Treatment Phase: Abnormal Scans (05/09/20 1300) Barriers/Navigation Needs: Coordination of Care (05/09/20 1300)   Interventions: Coordination of Care;Referrals (05/09/20 1300) Referrals: Medical Oncology (05/09/20 1300) Coordination of Care: Appts;Radiology (05/09/20 1300)        Acuity: Level 2-Minimal Needs (1-2 Barriers Identified) (05/09/20 1300)      met with patient and her daughter during initial consult with Dr. Baruch Gouty. All questions answered during visit. Pt informed that once PET scan approved with insurance then schedule as well as CT sim appt and follow up visit with Dr. Grayland Ormond. Contact info given and instructed to call with any further questions or needs. Pt verbalized understanding. Nothing further needed at this time.   Time Spent with Patient: 60 (05/09/20 1300)

## 2020-05-15 ENCOUNTER — Ambulatory Visit: Payer: Medicare PPO

## 2020-05-20 ENCOUNTER — Ambulatory Visit: Payer: Medicare PPO

## 2020-05-21 ENCOUNTER — Ambulatory Visit: Payer: Medicare PPO

## 2020-05-22 ENCOUNTER — Ambulatory Visit: Payer: Medicare PPO

## 2020-05-23 ENCOUNTER — Ambulatory Visit: Payer: Medicare PPO

## 2020-05-27 ENCOUNTER — Ambulatory Visit: Payer: Medicare PPO

## 2020-05-28 ENCOUNTER — Ambulatory Visit: Payer: Medicare PPO

## 2020-05-29 ENCOUNTER — Ambulatory Visit: Payer: Medicare PPO

## 2020-05-30 ENCOUNTER — Other Ambulatory Visit: Payer: Self-pay

## 2020-05-30 ENCOUNTER — Encounter
Admission: RE | Admit: 2020-05-30 | Discharge: 2020-05-30 | Disposition: A | Discharge status: Home / Self Care | Payer: Medicare PPO | Source: Ambulatory Visit | Attending: Radiation Oncology | Admitting: Radiation Oncology

## 2020-05-30 ENCOUNTER — Ambulatory Visit: Payer: Medicare PPO

## 2020-05-30 DIAGNOSIS — A419 Sepsis, unspecified organism: Secondary | ICD-10-CM | POA: Diagnosis not present

## 2020-05-30 DIAGNOSIS — C3490 Malignant neoplasm of unspecified part of unspecified bronchus or lung: Secondary | ICD-10-CM | POA: Insufficient documentation

## 2020-05-30 DIAGNOSIS — R0602 Shortness of breath: Secondary | ICD-10-CM | POA: Diagnosis not present

## 2020-05-30 LAB — GLUCOSE, CAPILLARY: Glucose-Capillary: 117 mg/dL — ABNORMAL HIGH (ref 70–99)

## 2020-05-30 MED ORDER — FLUDEOXYGLUCOSE F - 18 (FDG) INJECTION
7.4800 | Freq: Once | INTRAVENOUS | Status: AC | PRN
  Administered 2020-05-30: 7.48 via INTRAVENOUS

## 2020-05-31 ENCOUNTER — Ambulatory Visit: Payer: Medicare PPO

## 2020-06-01 ENCOUNTER — Other Ambulatory Visit: Payer: Self-pay

## 2020-06-01 ENCOUNTER — Encounter: Payer: Self-pay | Admitting: Intensive Care

## 2020-06-01 ENCOUNTER — Emergency Department: Payer: Medicare PPO

## 2020-06-01 ENCOUNTER — Inpatient Hospital Stay
Admission: EM | Admit: 2020-06-01 | Discharge: 2020-06-11 | DRG: 871 | Disposition: A | Discharge status: Hospice | Payer: Medicare PPO | Attending: Internal Medicine | Admitting: Internal Medicine

## 2020-06-01 DIAGNOSIS — E785 Hyperlipidemia, unspecified: Secondary | ICD-10-CM | POA: Diagnosis present

## 2020-06-01 DIAGNOSIS — Z20822 Contact with and (suspected) exposure to covid-19: Secondary | ICD-10-CM | POA: Diagnosis present

## 2020-06-01 DIAGNOSIS — Z66 Do not resuscitate: Secondary | ICD-10-CM | POA: Diagnosis present

## 2020-06-01 DIAGNOSIS — D649 Anemia, unspecified: Secondary | ICD-10-CM | POA: Diagnosis not present

## 2020-06-01 DIAGNOSIS — G4733 Obstructive sleep apnea (adult) (pediatric): Secondary | ICD-10-CM | POA: Diagnosis present

## 2020-06-01 DIAGNOSIS — J8410 Pulmonary fibrosis, unspecified: Secondary | ICD-10-CM | POA: Diagnosis present

## 2020-06-01 DIAGNOSIS — J9621 Acute and chronic respiratory failure with hypoxia: Secondary | ICD-10-CM | POA: Diagnosis not present

## 2020-06-01 DIAGNOSIS — R0600 Dyspnea, unspecified: Secondary | ICD-10-CM | POA: Diagnosis not present

## 2020-06-01 DIAGNOSIS — I10 Essential (primary) hypertension: Secondary | ICD-10-CM | POA: Diagnosis not present

## 2020-06-01 DIAGNOSIS — C7989 Secondary malignant neoplasm of other specified sites: Secondary | ICD-10-CM | POA: Diagnosis present

## 2020-06-01 DIAGNOSIS — C787 Secondary malignant neoplasm of liver and intrahepatic bile duct: Secondary | ICD-10-CM | POA: Diagnosis present

## 2020-06-01 DIAGNOSIS — I440 Atrioventricular block, first degree: Secondary | ICD-10-CM | POA: Diagnosis present

## 2020-06-01 DIAGNOSIS — Z8541 Personal history of malignant neoplasm of cervix uteri: Secondary | ICD-10-CM | POA: Diagnosis not present

## 2020-06-01 DIAGNOSIS — Z91018 Allergy to other foods: Secondary | ICD-10-CM

## 2020-06-01 DIAGNOSIS — D509 Iron deficiency anemia, unspecified: Secondary | ICD-10-CM | POA: Diagnosis present

## 2020-06-01 DIAGNOSIS — I5033 Acute on chronic diastolic (congestive) heart failure: Secondary | ICD-10-CM | POA: Diagnosis present

## 2020-06-01 DIAGNOSIS — Z7951 Long term (current) use of inhaled steroids: Secondary | ICD-10-CM

## 2020-06-01 DIAGNOSIS — R0902 Hypoxemia: Secondary | ICD-10-CM | POA: Diagnosis not present

## 2020-06-01 DIAGNOSIS — R9431 Abnormal electrocardiogram [ECG] [EKG]: Secondary | ICD-10-CM | POA: Diagnosis not present

## 2020-06-01 DIAGNOSIS — K219 Gastro-esophageal reflux disease without esophagitis: Secondary | ICD-10-CM | POA: Diagnosis present

## 2020-06-01 DIAGNOSIS — R778 Other specified abnormalities of plasma proteins: Secondary | ICD-10-CM | POA: Diagnosis not present

## 2020-06-01 DIAGNOSIS — A419 Sepsis, unspecified organism: Secondary | ICD-10-CM | POA: Diagnosis present

## 2020-06-01 DIAGNOSIS — I471 Supraventricular tachycardia: Secondary | ICD-10-CM | POA: Diagnosis present

## 2020-06-01 DIAGNOSIS — I361 Nonrheumatic tricuspid (valve) insufficiency: Secondary | ICD-10-CM | POA: Diagnosis not present

## 2020-06-01 DIAGNOSIS — I340 Nonrheumatic mitral (valve) insufficiency: Secondary | ICD-10-CM | POA: Diagnosis not present

## 2020-06-01 DIAGNOSIS — C799 Secondary malignant neoplasm of unspecified site: Secondary | ICD-10-CM | POA: Diagnosis not present

## 2020-06-01 DIAGNOSIS — R0602 Shortness of breath: Secondary | ICD-10-CM

## 2020-06-01 DIAGNOSIS — Z7982 Long term (current) use of aspirin: Secondary | ICD-10-CM

## 2020-06-01 DIAGNOSIS — R918 Other nonspecific abnormal finding of lung field: Secondary | ICD-10-CM | POA: Diagnosis not present

## 2020-06-01 DIAGNOSIS — H3530 Unspecified macular degeneration: Secondary | ICD-10-CM | POA: Diagnosis present

## 2020-06-01 DIAGNOSIS — Z7189 Other specified counseling: Secondary | ICD-10-CM

## 2020-06-01 DIAGNOSIS — I248 Other forms of acute ischemic heart disease: Secondary | ICD-10-CM | POA: Diagnosis not present

## 2020-06-01 DIAGNOSIS — Z888 Allergy status to other drugs, medicaments and biological substances status: Secondary | ICD-10-CM

## 2020-06-01 DIAGNOSIS — L89152 Pressure ulcer of sacral region, stage 2: Secondary | ICD-10-CM | POA: Diagnosis present

## 2020-06-01 DIAGNOSIS — G893 Neoplasm related pain (acute) (chronic): Secondary | ICD-10-CM | POA: Diagnosis not present

## 2020-06-01 DIAGNOSIS — C3490 Malignant neoplasm of unspecified part of unspecified bronchus or lung: Secondary | ICD-10-CM | POA: Diagnosis present

## 2020-06-01 DIAGNOSIS — J189 Pneumonia, unspecified organism: Secondary | ICD-10-CM | POA: Diagnosis present

## 2020-06-01 DIAGNOSIS — J9691 Respiratory failure, unspecified with hypoxia: Secondary | ICD-10-CM | POA: Diagnosis not present

## 2020-06-01 DIAGNOSIS — Z515 Encounter for palliative care: Secondary | ICD-10-CM

## 2020-06-01 DIAGNOSIS — Z87891 Personal history of nicotine dependence: Secondary | ICD-10-CM

## 2020-06-01 DIAGNOSIS — I110 Hypertensive heart disease with heart failure: Secondary | ICD-10-CM | POA: Diagnosis present

## 2020-06-01 DIAGNOSIS — M5090 Cervical disc disorder, unspecified, unspecified cervical region: Secondary | ICD-10-CM | POA: Diagnosis present

## 2020-06-01 DIAGNOSIS — C3491 Malignant neoplasm of unspecified part of right bronchus or lung: Secondary | ICD-10-CM | POA: Insufficient documentation

## 2020-06-01 DIAGNOSIS — L8990 Pressure ulcer of unspecified site, unspecified stage: Secondary | ICD-10-CM | POA: Diagnosis present

## 2020-06-01 DIAGNOSIS — J9601 Acute respiratory failure with hypoxia: Secondary | ICD-10-CM | POA: Diagnosis present

## 2020-06-01 DIAGNOSIS — J9602 Acute respiratory failure with hypercapnia: Secondary | ICD-10-CM | POA: Diagnosis present

## 2020-06-01 DIAGNOSIS — Z79899 Other long term (current) drug therapy: Secondary | ICD-10-CM

## 2020-06-01 HISTORY — DX: Malignant neoplasm of unspecified part of unspecified bronchus or lung: C34.90

## 2020-06-01 LAB — CBC
HCT: 45.7 % (ref 36.0–46.0)
Hemoglobin: 14.9 g/dL (ref 12.0–15.0)
MCH: 28.3 pg (ref 26.0–34.0)
MCHC: 32.6 g/dL (ref 30.0–36.0)
MCV: 86.9 fL (ref 80.0–100.0)
Platelets: 194 10*3/uL (ref 150–400)
RBC: 5.26 MIL/uL — ABNORMAL HIGH (ref 3.87–5.11)
RDW: 16.7 % — ABNORMAL HIGH (ref 11.5–15.5)
WBC: 30.1 10*3/uL — ABNORMAL HIGH (ref 4.0–10.5)
nRBC: 0.3 % — ABNORMAL HIGH (ref 0.0–0.2)

## 2020-06-01 LAB — BASIC METABOLIC PANEL
Anion gap: 12 (ref 5–15)
BUN: 19 mg/dL (ref 8–23)
CO2: 24 mmol/L (ref 22–32)
Calcium: 9.1 mg/dL (ref 8.9–10.3)
Chloride: 97 mmol/L — ABNORMAL LOW (ref 98–111)
Creatinine, Ser: 0.75 mg/dL (ref 0.44–1.00)
GFR, Estimated: >60 mL/min (ref >60)
Glucose, Bld: 142 mg/dL — ABNORMAL HIGH (ref 70–99)
Potassium: 3.9 mmol/L (ref 3.5–5.1)
Sodium: 133 mmol/L — ABNORMAL LOW (ref 135–145)

## 2020-06-01 LAB — RESP PANEL BY RT-PCR (FLU A&B, COVID) ARPGX2
Influenza A by PCR: NEGATIVE
Influenza B by PCR: NEGATIVE
SARS Coronavirus 2 by RT PCR: NEGATIVE

## 2020-06-01 LAB — LACTIC ACID, PLASMA: Lactic Acid, Venous: 1.2 mmol/L (ref 0.5–1.9)

## 2020-06-01 LAB — TROPONIN I (HIGH SENSITIVITY)
Troponin I (High Sensitivity): 59 ng/L — ABNORMAL HIGH (ref <18)
Troponin I (High Sensitivity): 49 ng/L — ABNORMAL HIGH (ref <18)

## 2020-06-01 LAB — PROTIME-INR
INR: 1.1 (ref 0.8–1.2)
Prothrombin Time: 13.8 seconds (ref 11.4–15.2)

## 2020-06-01 LAB — POC SARS CORONAVIRUS 2 AG -  ED: SARS Coronavirus 2 Ag: NEGATIVE

## 2020-06-01 LAB — D-DIMER, QUANTITATIVE: D-Dimer, Quant: 3.41 ug/mL-FEU — ABNORMAL HIGH (ref 0.00–0.50)

## 2020-06-01 LAB — PROCALCITONIN: Procalcitonin: 0.15 ng/mL

## 2020-06-01 MED ORDER — IPRATROPIUM-ALBUTEROL 0.5-2.5 (3) MG/3ML IN SOLN
3.0000 mL | RESPIRATORY_TRACT | Status: DC | PRN
  Administered 2020-06-02: 3 mL via RESPIRATORY_TRACT
  Filled 2020-06-01: qty 3

## 2020-06-01 MED ORDER — AZELASTINE HCL 0.1 % NA SOLN
1.0000 | Freq: Two times a day (BID) | NASAL | Status: DC
  Administered 2020-06-02 – 2020-06-11 (×17): 1 via NASAL
  Filled 2020-06-01 (×2): qty 30

## 2020-06-01 MED ORDER — ENOXAPARIN SODIUM 40 MG/0.4ML ~~LOC~~ SOLN
40.0000 mg | SUBCUTANEOUS | Status: DC
  Administered 2020-06-01 – 2020-06-10 (×10): 40 mg via SUBCUTANEOUS
  Filled 2020-06-01 (×9): qty 0.4

## 2020-06-01 MED ORDER — SODIUM CHLORIDE 0.9 % IV SOLN
500.0000 mg | INTRAVENOUS | Status: DC
  Administered 2020-06-02 – 2020-06-04 (×3): 500 mg via INTRAVENOUS
  Filled 2020-06-01 (×4): qty 500

## 2020-06-01 MED ORDER — FLUTICASONE PROPIONATE 50 MCG/ACT NA SUSP
1.0000 | Freq: Two times a day (BID) | NASAL | Status: DC
  Administered 2020-06-02 – 2020-06-11 (×17): 1 via NASAL
  Filled 2020-06-01 (×2): qty 16

## 2020-06-01 MED ORDER — LORATADINE 10 MG PO TABS
10.0000 mg | ORAL_TABLET | Freq: Every day | ORAL | Status: DC
  Administered 2020-06-02 – 2020-06-11 (×10): 10 mg via ORAL
  Filled 2020-06-01 (×10): qty 1

## 2020-06-01 MED ORDER — SODIUM CHLORIDE 0.9 % IV SOLN: 1.0000 g | INTRAVENOUS | Status: DC

## 2020-06-01 MED ORDER — METOPROLOL TARTRATE 25 MG PO TABS
25.0000 mg | ORAL_TABLET | Freq: Two times a day (BID) | ORAL | Status: DC
  Administered 2020-06-02 – 2020-06-11 (×19): 25 mg via ORAL
  Filled 2020-06-01 (×19): qty 1

## 2020-06-01 MED ORDER — METOPROLOL TARTRATE 5 MG/5ML IV SOLN: 5.0000 mg | Freq: Once | INTRAVENOUS | Status: AC

## 2020-06-01 MED ORDER — PANTOPRAZOLE SODIUM 40 MG PO TBEC
40.0000 mg | DELAYED_RELEASE_TABLET | Freq: Every day | ORAL | Status: DC
  Administered 2020-06-02 – 2020-06-11 (×10): 40 mg via ORAL
  Filled 2020-06-01 (×10): qty 1

## 2020-06-01 MED ORDER — IOHEXOL 350 MG/ML SOLN
75.0000 mL | Freq: Once | INTRAVENOUS | Status: AC | PRN
  Administered 2020-06-01: 75 mL via INTRAVENOUS

## 2020-06-01 MED ORDER — AZELASTINE-FLUTICASONE 137-50 MCG/ACT NA SUSP: 1.0000 | Freq: Two times a day (BID) | NASAL | Status: DC

## 2020-06-01 MED ORDER — ASPIRIN EC 81 MG PO TBEC
81.0000 mg | DELAYED_RELEASE_TABLET | Freq: Two times a day (BID) | ORAL | Status: DC
  Administered 2020-06-01 – 2020-06-11 (×19): 81 mg via ORAL
  Filled 2020-06-01 (×18): qty 1

## 2020-06-01 MED ORDER — SODIUM CHLORIDE 0.9 % IV SOLN
1.0000 g | Freq: Once | INTRAVENOUS | Status: AC
  Administered 2020-06-01: 1 g via INTRAVENOUS
  Filled 2020-06-01: qty 10

## 2020-06-01 MED ORDER — LACTATED RINGERS IV BOLUS
1000.0000 mL | Freq: Once | INTRAVENOUS | Status: AC
  Administered 2020-06-02: 1000 mL via INTRAVENOUS

## 2020-06-01 MED ORDER — ADULT MULTIVITAMIN W/MINERALS CH
1.0000 | ORAL_TABLET | Freq: Every day | ORAL | Status: DC
  Administered 2020-06-02 – 2020-06-11 (×10): 1 via ORAL
  Filled 2020-06-01 (×12): qty 1

## 2020-06-01 MED ORDER — LOSARTAN POTASSIUM 50 MG PO TABS
50.0000 mg | ORAL_TABLET | Freq: Every day | ORAL | Status: DC
  Administered 2020-06-02 – 2020-06-11 (×10): 50 mg via ORAL
  Filled 2020-06-01 (×10): qty 1

## 2020-06-01 MED ORDER — SODIUM CHLORIDE 0.9 % IV SOLN: 500.0000 mg | INTRAVENOUS | Status: DC

## 2020-06-01 MED ORDER — FERROUS SULFATE 325 (65 FE) MG PO TABS
325.0000 mg | ORAL_TABLET | Freq: Two times a day (BID) | ORAL | Status: DC
  Administered 2020-06-02 – 2020-06-11 (×16): 325 mg via ORAL
  Filled 2020-06-01 (×19): qty 1

## 2020-06-01 MED ORDER — SODIUM CHLORIDE 0.9 % IV SOLN
500.0000 mg | Freq: Once | INTRAVENOUS | Status: AC
  Administered 2020-06-01: 500 mg via INTRAVENOUS
  Filled 2020-06-01: qty 500

## 2020-06-01 MED ORDER — METOPROLOL TARTRATE 5 MG/5ML IV SOLN
INTRAVENOUS | Status: AC
  Administered 2020-06-01: 5 mg via INTRAVENOUS
  Filled 2020-06-01: qty 5

## 2020-06-01 MED ORDER — VITAMIN B-12 1000 MCG PO TABS
1000.0000 ug | ORAL_TABLET | Freq: Two times a day (BID) | ORAL | Status: DC
  Administered 2020-06-02 – 2020-06-11 (×18): 1000 ug via ORAL
  Filled 2020-06-01 (×20): qty 1

## 2020-06-01 MED ORDER — LACTATED RINGERS IV BOLUS
1000.0000 mL | Freq: Once | INTRAVENOUS | Status: AC
  Administered 2020-06-01: 1000 mL via INTRAVENOUS

## 2020-06-01 MED ORDER — SODIUM CHLORIDE 0.9 % IV SOLN: Freq: Once | INTRAVENOUS | Status: AC

## 2020-06-01 NOTE — ED Notes (Signed)
Secure chat message sent to Rufina Falco, np to notify of hr.

## 2020-06-01 NOTE — H&P (Addendum)
Chief Complaint: Patient presented on account of worsening shortness of breath HPI:  Kim Newman is an 87 y.o. female.  Patient is a pleasant elderly female with remote history of tobacco smoking, history of hypertension, obstructive sleep apnea, history of lung nodules (incidental finding 4 years ago) with recent diagnosis of lung cancer who was recommended radiation treatment.  As per patient, she has been in her usual state of health but over the past week, has had worsening shortness of breath with associated dry cough.  Symptoms have been associated with easy fatigability and generalized weakness has decided to come to the emergency room today for further evaluation and management.  She denied any associated chest pain or palpitations.  Denied any nausea vomiting.  Cough usually dry but when productive, it is clear without any blood.  He denied any known sick contacts.  He is fully vaccinated number listed for the Covid vaccine.  On presentation, patient was noted to be hypoxic requiring oxygen supplementation.  Heart cell count was noted to be markedly elevated at 30K.  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  Patient was referred to hospitalist service for further treatment and management.  She is full code.  Yet to decide on her advanced directives.  Past Medical History:  Diagnosis Date  . Arthritis   . Cataract   . Cervical cancer ([place]) 1997  . GERD (gastroesophageal reflux disease)   . Hypertension   . Lung cancer ([place])   . Macular degeneration   . Obstructive sleep apnea   . Sleep apnea     Past Surgical History:  Procedure Laterality Date  . ABDOMINAL HYSTERECTOMY  1997  . BREAST BIOPSY Left 2002   core   . COLONOSCOPY  2012    Family History  Problem Relation Age of Onset  . Breast cancer Neg Hx    Social History:  reports that she has quit smoking. Her  smoking use included cigarettes. She has never used smokeless tobacco. She reports current alcohol use. She reports that she does not use drugs.  Allergies:  Allergies  Allergen Reactions  . Statins   . Strawberry (Diagnostic)     (Not in a hospital admission)   Results for orders placed or performed during the hospital encounter of 06/01/20 (from the past 48 hour(s))  Basic metabolic panel     Status: Abnormal   Collection Time: 06/01/20 12:23 PM  Result Value Ref Range   Sodium 133 (L) 135 - 145 mmol/L   Potassium 3.9 3.5 - 5.1 mmol/L   Chloride 97 (L) 98 - 111 mmol/L   CO2 24 22 - 32 mmol/L   Glucose, Bld 142 (H) 70 - 99 mg/dL    Comment: Glucose reference range applies only to samples taken after fasting for at least 8 hours.   BUN 19 8 - 23 mg/dL   Creatinine, Ser 0.75 0.44 - 1.00 mg/dL   Calcium 9.1 8.9 - 10.3 mg/dL   GFR, Estimated >60 >60 mL/min    Comment: (NOTE) Calculated using the CKD-EPI Creatinine Equation (2021)    Anion gap 12 5 - 15    Comment: Performed at [facility], [address] [place], [place] [address]  CBC     Status: Abnormal   Collection Time: 06/01/20 12:23 PM  Result Value Ref Range   WBC 30.1 (H) 4.0 - 10.5 K/uL   RBC 5.26 (H) 3.87 -  5.11 MIL/uL   Hemoglobin 14.9 12.0 - 15.0 g/dL   HCT 45.7 36.0 - 46.0 %   MCV 86.9 80.0 - 100.0 fL   MCH 28.3 26.0 - 34.0 pg   MCHC 32.6 30.0 - 36.0 g/dL   RDW 16.7 (H) 11.5 - 15.5 %   Platelets 194 150 - 400 K/uL   nRBC 0.3 (H) 0.0 - 0.2 %    Comment: Performed at [facility], [place], [place] [address]  Troponin I (High Sensitivity)     Status: Abnormal   Collection Time: 06/01/20 12:23 PM  Result Value Ref Range   Troponin I (High Sensitivity) 59 (H) <18 ng/L    Comment: (NOTE) Elevated high sensitivity troponin I (hsTnI) values and significant  changes across serial measurements may suggest ACS but many other  chronic and acute conditions are known to elevate  hsTnI results.  Refer to the "Links" section for chest pain algorithms and additional  guidance. Performed at [facility], [address] [place], [place] [address]   Protime-INR (order if Patient is taking Coumadin / Warfarin)     Status: None   Collection Time: 06/01/20 12:23 PM  Result Value Ref Range   Prothrombin Time 13.8 11.4 - 15.2 seconds   INR 1.1 0.8 - 1.2    Comment: (NOTE) INR goal varies based on device and disease states. Performed at [facility], [address] [place], [place] [address]   D-dimer, quantitative (not at [facility])     Status: Abnormal   Collection Time: 06/01/20 12:23 PM  Result Value Ref Range   D-Dimer, Quant 3.41 (H) 0.00 - 0.50 ug/mL-FEU    Comment: (NOTE) At the manufacturer cut-off value of 0.5 g/mL FEU, this assay has a negative predictive value of 95-100%.This assay is intended for use in conjunction with a clinical pretest probability (PTP) assessment model to exclude pulmonary embolism (PE) and deep venous thrombosis (DVT) in outpatients suspected of PE or DVT. Results should be correlated with clinical presentation. Performed at [facility], [place] [address] [place], [place] [address]   Troponin I (High Sensitivity)     Status: Abnormal   Collection Time: 06/01/20  6:08 PM  Result Value Ref Range   Troponin I (High Sensitivity) 49 (H) <18 ng/L    Comment: (NOTE) Elevated high sensitivity troponin I (hsTnI) values and significant  changes across serial measurements may suggest ACS but many other  chronic and acute conditions are known to elevate hsTnI results.  Refer to the "Links" section for chest pain algorithms and additional  guidance. Performed at [facility], [address] [place], [place] [address]   Procalcitonin - Baseline     Status: None   Collection Time: 06/01/20  8:09 PM  Result Value Ref Range   Procalcitonin 0.15 ng/mL    Comment:        Interpretation: PCT  (Procalcitonin) <= 0.5 ng/mL: Systemic infection (sepsis) is not likely. Local bacterial infection is possible. (NOTE)       Sepsis PCT Algorithm           Lower Respiratory Tract                                      Infection PCT Algorithm    ----------------------------     ----------------------------         PCT < 0.25 ng/mL  PCT < 0.10 ng/mL          Strongly encourage             Strongly discourage   discontinuation of antibiotics    initiation of antibiotics    ----------------------------     -----------------------------       PCT 0.25 - 0.50 ng/mL            PCT 0.10 - 0.25 ng/mL               OR       >80% decrease in PCT            Discourage initiation of                                            antibiotics      Encourage discontinuation           of antibiotics    ----------------------------     -----------------------------         PCT >= 0.50 ng/mL              PCT 0.26 - 0.50 ng/mL               AND        <80% decrease in PCT             Encourage initiation of                                             antibiotics       Encourage continuation           of antibiotics    ----------------------------     -----------------------------        PCT >= 0.50 ng/mL                  PCT > 0.50 ng/mL               AND         increase in PCT                  Strongly encourage                                      initiation of antibiotics    Strongly encourage escalation           of antibiotics                                     -----------------------------                                           PCT <= 0.25 ng/mL                                                 OR                                        >  80% decrease in PCT                                      Discontinue / Do not initiate                                             antibiotics  Performed at [facility], [address] [place], [place] [address]   Lactic acid,  plasma     Status: None   Collection Time: 06/01/20  8:09 PM  Result Value Ref Range   Lactic Acid, Venous 1.2 0.5 - 1.9 mmol/L    Comment: Performed at [facility], [address] [place], [place] [address]  POC SARS Coronavirus 2 Ag-ED - Nasal Swab (BD Veritor Kit)     Status: None   Collection Time: 06/01/20  8:37 PM  Result Value Ref Range   SARS Coronavirus 2 Ag Negative Negative   DG Chest 2 View  Result Date: 06/01/2020 CLINICAL DATA:  Shortness of breath. Known exposure to COVID-19 this week. EXAM: CHEST - 2 VIEW COMPARISON:  CT imaging from May 22, 2020 FINDINGS: The known right apical mass is appreciated. The known right middle lobe mass is best appreciated on the lateral view. Known interstitial lung disease is identified. Given the significant chronic changes in the lungs and the lack of a comparison chest x-ray, it would be difficult to exclude mild acute on chronic ground-glass infiltrates. IMPRESSION: Given the significant chronic changes in the lungs and the lack of a comparison chest x-ray, it would be difficult to exclude mild acute on chronic ground-glass infiltrates. Known malignancy on the right as above. Known interstitial lung disease. Electronically Signed   By: Dorise Bullion III M.D   On: 06/01/2020 13:01   CT Angio Chest PE W and/or Wo Contrast  Result Date: 06/01/2020 CLINICAL DATA:  87 year old female with shortness of breath. Right apical mass. EXAM: CT ANGIOGRAPHY CHEST WITH CONTRAST TECHNIQUE: Multidetector CT imaging of the chest was performed using the standard protocol during bolus administration of intravenous contrast. Multiplanar CT image reconstructions and MIPs were obtained to evaluate the vascular anatomy. CONTRAST:  33m OMNIPAQUE IOHEXOL 350 MG/ML SOLN COMPARISON:  Chest CT dated 05/02/2020. FINDINGS: Cardiovascular: There is no cardiomegaly or pericardial effusion. Coronary vascular calcification. There is advanced atherosclerotic  calcification the thoracic aorta. No aneurysmal dilatation or dissection. Evaluation of the pulmonary arteries is limited due to respiratory motion artifact. No large or central pulmonary artery embolus identified. Mediastinum/Nodes: Mildly enlarged right hilar lymph node measures 12 mm. There is a small hiatal hernia. No mediastinal fluid collection. Lungs/Pleura: Right apical consolidation/mass as seen previously. Multiple bilateral pulmonary nodules consistent with metastatic disease. Interval increase in the size of the nodular with prosthesis since the prior CT. There is an area of consolidation involving the right middle lobe progressed since the prior CT. There is increased interstitial prominence and diffuse ground-glass density throughout the upper lobes and right middle lobe compared to prior CT which may represent progression of lymphangitic carcinomatosis or pneumonia. Clinical correlation is recommended. There is no pleural effusion pneumothorax. The central airways are patent. Upper Abdomen: Faint hepatic hypodense lesions most consistent with metastatic disease. Musculoskeletal: Erosive changes of the right first and second ribs secondary to right apical mass. Review  of the MIP images confirms the above findings. IMPRESSION: 1. No CT evidence of central pulmonary artery embolus. 2. Right apical consolidation/mass as seen previously. Interval increase in the size of the metastatic disease compared to prior CT. 3. Interval increase in the interstitial prominence and diffuse ground-glass density throughout the upper lobes and right middle lobe compared to the prior CT which may represent progression of lymphangitic carcinomatosis or pneumonia. 4. Mildly enlarged right hilar lymph node. 5. Hepatic metastatic disease. 6. Aortic Atherosclerosis (ICD10-I70.0). Electronically Signed   By: Anner Crete M.D.   On: 06/01/2020 21:56    Review of Systems  Constitutional: Positive for activity change and  fatigue. Negative for fever.  HENT: Negative.   Eyes: Negative.   Respiratory: Positive for cough, shortness of breath and wheezing.   Gastrointestinal: Negative.   Endocrine: Negative.   Genitourinary: Negative.   Musculoskeletal: Negative.   Neurological: Negative.   Hematological: Negative.   Psychiatric/Behavioral: Negative.     Blood pressure (!) 152/70, pulse (!) 124, temperature 98.7 F (37.1 C), temperature source Oral, resp. rate (!) 22, height 5' 2" (1.575 m), weight 66.7 kg, SpO2 94 %. Physical Exam Constitutional:      Appearance: She is well-developed.  HENT:     Head: Normocephalic.  Eyes:     Pupils: Pupils are equal, round, and reactive to light.  Cardiovascular:     Rate and Rhythm: Normal rate and regular rhythm.  Pulmonary:     Effort: Respiratory distress present.     Comments: Coarse breath sounds L>R Abdominal:     General: Bowel sounds are normal.     Palpations: Abdomen is soft.  Musculoskeletal:        General: Normal range of motion.     Cervical back: Normal range of motion.  Skin:    General: Skin is warm and dry.  Neurological:     General: No focal deficit present.     Mental Status: She is alert.  Psychiatric:        Mood and Affect: Mood normal.      Assessment/Plan Acute hypoxia secondary to community-acquired pneumonia.  Patient has underlying right upper lobe lesion suspected to be lung cancer.  Patient was being prepared for XRT in the coming days.  Patient will be treated for possible postobstructive pneumonia.  Empiric antibiotics with Rocephin and azithromycin were initiated.  CT scan ruled out pulmonary emboli  Sepsis secondary to pneumonia.  X-ray showing focus of infection.  Patient also has tachycardia, tachypnea and leukocytosis.  Lactic acid and procalcitonin were within normal limits.  Blood and sputum cultures pending  Leukocytosis most likely secondary to community-acquired pneumonia  History of right apical lobe mass.  Follows up with Dr Donella Stade.  Unclear pathology as patient is yet to have a biopsy.  Pulmonology consult and evaluation will be sought.  Abnormal cardiac markers: High sensitive troponin markers mildly elevated.  Cardiac markers have remained flat.  Most likely type II in etiology from demand ischemia.  Will obtain echocardiogram to rule out any acute wall motion abnormalities.  Abnormal EKG: Evidence of paroxysms of SVT. EKG confirmed Afib. TSH and FT4 ordered. ECHO in a.m.Cardiology consult in a.m  Chronic essential hypertension: Blood pressure stable.  Resume home medication.  Monitor blood pressure.  History of obstructive sleep apnea: On CPAP at home. Family to bring in device  [place], MD 06/01/2020, 11:02 PM

## 2020-06-01 NOTE — ED Notes (Addendum)
Pt leaving for CT. Will hang antibiotics once pt back to room.

## 2020-06-01 NOTE — ED Provider Notes (Signed)
[facility] Emergency Department Provider Note  ____________________________________________   I have reviewed the triage vital signs and the nursing notes.   HISTORY  Chief Complaint Shortness of Breath   History limited by: Not Limited   HPI Kim Newman is a 87 y.o. female who presents to the emergency department today because of concerns for shortness of breath and weakness.  Patient has been short of breath over the past 3 days.  She has noticed generalized weakness with this.  They did have exposure to a person who tested positive for COVID.  The patient does have some right arm weakness that has been present for about a month.  She has known cervical disc disease as well as recently diagnosed lung cancer.  Patient has not appreciated any fevers.  Records reviewed. Per medical record review patient has a history of gerd, lung cancer.   Past Medical History:  Diagnosis Date  . Arthritis   . Cataract   . Cervical cancer ([place]) 1997  . GERD (gastroesophageal reflux disease)   . Hypertension   . Lung cancer ([place])   . Macular degeneration   . Obstructive sleep apnea   . Sleep apnea     Patient Active Problem List   Diagnosis Date Noted  . Metastatic primary lung cancer, right ([place]) 06/01/2020  . Iron deficiency anemia due to chronic blood loss 04/01/2016  . Symptomatic anemia 03/17/2016    Past Surgical History:  Procedure Laterality Date  . ABDOMINAL HYSTERECTOMY  1997  . BREAST BIOPSY Left 2002   core   . COLONOSCOPY  2012    Prior to Admission medications   Medication Sig Start Date End Date Taking? Authorizing Provider  ADVAIR DISKUS 100-50 MCG/DOSE AEPB Inhale 2 puffs into the lungs 2 (two) times daily.  03/04/16   [provider]  aspirin EC 81 MG tablet Take 1 tablet by mouth 2 (two) times daily.     [provider]  Calcium Carbonate-Vit D-Min (CALCIUM 1200 PO) Take 1 tablet by mouth 2 (two) times daily.     [provider]  cetirizine (ZYRTEC) 10 MG tablet Take 1 tablet by mouth daily.    [provider]  Cyanocobalamin 1000 MCG TBCR Take 2 tablets by mouth daily.    [provider]  DYMISTA 137-50 MCG/ACT SUSP Place 1 spray into the nose 2 (two) times daily.  02/10/16   [provider]  ferrous sulfate 325 (65 FE) MG tablet Take 1 tablet (325 mg total) by mouth 2 (two) times daily with a meal. 03/18/16   Sainani, Belia Heman, MD  losartan (COZAAR) 50 MG tablet Take 1 tablet by mouth daily. 02/14/16   [provider]  Multiple Vitamin (MULTIVITAMIN) tablet Take 1 tablet by mouth daily.    [provider]  Multiple Vitamins-Minerals (PRESERVISION AREDS 2 PO) Take 1 tablet by mouth 2 (two) times daily.     [provider]  Omega-3 Fatty Acids (FISH OIL PO) Take 1 capsule by mouth daily.    [provider]  omeprazole (PRILOSEC) 40 MG capsule Take 1 capsule by mouth daily.    [provider]  telmisartan (MICARDIS) 40 MG tablet  02/23/20   [provider]  vitamin E 180 MG (400 UNITS) capsule Take 400 Units by mouth daily.    [provider]    Allergies Statins and Strawberry (diagnostic)  Family History  Problem Relation Age of Onset  . Breast cancer Neg Hx  Social History Social History   Tobacco Use  . Smoking status: Former Smoker    Types: Cigarettes  . Smokeless tobacco: Never Used  Substance Use Topics  . Alcohol use: Yes  . Drug use: No    Review of Systems Constitutional: No fever/chills Eyes: No visual changes. ENT: No sore throat. Cardiovascular: Denies chest pain. Respiratory: Positive for shortness of breath. Gastrointestinal: No abdominal pain.  No nausea, no vomiting.  No diarrhea.   Genitourinary: Negative for dysuria. Musculoskeletal: Negative for back pain. Skin: Negative for rash. Neurological: Positive for right arm  weakness. ____________________________________________   PHYSICAL EXAM:  VITAL SIGNS: ED Triage Vitals  Enc Vitals Group     BP 06/01/20 1209 (!) 93/56     Pulse Rate 06/01/20 1209 100     Resp 06/01/20 1209 (!) 28     Temp 06/01/20 1209 98.1 F (36.7 C)     Temp Source 06/01/20 1209 Oral     SpO2 06/01/20 1209 (!) 83 %     Weight 06/01/20 1216 147 lb (66.7 kg)     Height 06/01/20 1216 5\' 2"  (1.575 m)     Head Circumference --      Peak Flow --      Pain Score 06/01/20 1215 7   Constitutional: Alert and oriented.  Eyes: Conjunctivae are normal.  ENT      Head: Normocephalic and atraumatic.      Nose: No congestion/rhinnorhea.      Mouth/Throat: Mucous membranes are moist.      Neck: No stridor. Hematological/Lymphatic/Immunilogical: No cervical lymphadenopathy. Cardiovascular: Normal rate, regular rhythm.  No murmurs, rubs, or gallops.  Respiratory: Tachypnea. Increased respiratory effort. Some wheezing diffusely.  Gastrointestinal: Soft and non tender. No rebound. No guarding.  Genitourinary: Deferred Musculoskeletal: Normal range of motion in all extremities. No lower extremity edema. Neurologic:  Normal speech and language. No gross focal neurologic deficits are appreciated.  Skin:  Skin is warm, dry and intact. No rash noted. Psychiatric: Mood and affect are normal. Speech and behavior are normal. Patient exhibits appropriate insight and judgment.  ____________________________________________    LABS (pertinent positives/negatives)  COVID negative Lactic acid 1.2 Procalcitonin 0.15 D-dimer 3.41 CBC wbc 30.1, hgb 14.9, plt 194 Trop hs 49 BMP na 133, k 3.9, glu 142, cr 0.75  ____________________________________________   EKG  I, Nance Pear, attending physician, personally viewed and interpreted this EKG  EKG Time: 1210 Rate: 96 Rhythm: sinus rhythm with 1st degree av block Axis: normal Intervals: qtc 447 QRS: narrow ST changes: no st  elevation Impression: abnormal ekg  ____________________________________________    RADIOLOGY  CXR Questionable mild acute on chronic ground glass opacities  CT angio No PE. Metastatic disease. Concern for worsening carcinomatosis vs infection.  ____________________________________________   PROCEDURES  Procedures  ____________________________________________   INITIAL IMPRESSION / ASSESSMENT AND PLAN / ED COURSE  Pertinent labs & imaging results that were available during my care of the patient were reviewed by me and considered in my medical decision making (see chart for details).   Presented to the emergency department today because of concerns for shortness of breath and weakness.  Patient was found to be hypoxic on room air.  Patient was placed on nasal cannula and oxygen saturation responded appropriately.  Chest x-ray was concerning for possible acute on chronic groundglass opacities.  Patient does have recent diagnosis of lung cancer.  Did have concern for possible PE given history of cancer and hypoxia.  CT angio however did not  show any PE.  Did show possible pneumonia.  Patient was found to have a significant leukocytosis here.  Because of this she was started on antibiotics.  Rapid COVID was negative.  Will plan on admission to the hospital service.   ____________________________________________   FINAL CLINICAL IMPRESSION(S) / ED DIAGNOSES  Final diagnoses:  Hypoxia  Shortness of breath     Note: This dictation was prepared with Dragon dictation. Any transcriptional errors that result from this process are unintentional     Nance Pear, MD 06/01/20 2236

## 2020-06-01 NOTE — ED Notes (Signed)
Provider Archie Balboa notified pt reports uses CPAP at night.

## 2020-06-01 NOTE — Progress Notes (Deleted)
Kim Newman  [phone] [phone]) [phone]  ID: Kim Newman OB: 06/01/1927  MR#: [id number]  OEV#:[id number]  Patient Care Team: Baxter Hire, MD as PCP - General (Internal Medicine) Telford Nab, RN as Oncology Nurse Navigator  CHIEF COMPLAINT: Stage IV right lung cancer.  INTERVAL HISTORY: ***  REVIEW OF SYSTEMS:   ROS  As per HPI. Otherwise, a complete review of systems is negative.  PAST MEDICAL HISTORY: Past Medical History:  Diagnosis Date  . Arthritis   . Cataract   . Cervical cancer ([place]) 1997  . GERD (gastroesophageal reflux disease)   . Hypertension   . Macular degeneration   . Obstructive sleep apnea   . Sleep apnea     PAST SURGICAL HISTORY: Past Surgical History:  Procedure Laterality Date  . ABDOMINAL HYSTERECTOMY  1997  . BREAST BIOPSY Left 2002   core   . COLONOSCOPY  2012    FAMILY HISTORY: Family History  Problem Relation Age of Onset  . Breast cancer Neg Hx     ADVANCED DIRECTIVES (Y/N):  N  HEALTH MAINTENANCE: Social History   Tobacco Use  . Smoking status: Former Research scientist (life sciences)  . Smokeless tobacco: Never Used  Substance Use Topics  . Alcohol use: Yes  . Drug use: No     Colonoscopy:  PAP:  Bone density:  Lipid panel:  Allergies  Allergen Reactions  . Statins   . Strawberry (Diagnostic)     Current Outpatient Medications  Medication Sig Dispense Refill  . ADVAIR DISKUS 100-50 MCG/DOSE AEPB Inhale 2 puffs into the lungs 2 (two) times daily.     Marland Kitchen aspirin EC 81 MG tablet Take 1 tablet by mouth 2 (two) times daily.     . Calcium Carbonate-Vit D-Min (CALCIUM 1200 PO) Take 1 tablet by mouth 2 (two) times daily.    . cetirizine (ZYRTEC) 10 MG tablet Take 1 tablet by mouth daily.    . Cyanocobalamin 1000 MCG TBCR Take 2 tablets by mouth daily.    Marland Kitchen DYMISTA 137-50 MCG/ACT SUSP Place 1 spray into the nose 2 (two) times daily.     . ferrous sulfate 325 (65 FE) MG tablet Take 1 tablet (325 mg  total) by mouth 2 (two) times daily with a meal. 60 tablet 3  . losartan (COZAAR) 50 MG tablet Take 1 tablet by mouth daily.    . Multiple Vitamin (MULTIVITAMIN) tablet Take 1 tablet by mouth daily.    . Multiple Vitamins-Minerals (PRESERVISION AREDS 2 PO) Take 1 tablet by mouth 2 (two) times daily.     . Omega-3 Fatty Acids (FISH OIL PO) Take 1 capsule by mouth daily.    Marland Kitchen omeprazole (PRILOSEC) 40 MG capsule Take 1 capsule by mouth daily.    Marland Kitchen telmisartan (MICARDIS) 40 MG tablet     . vitamin E 180 MG (400 UNITS) capsule Take 400 Units by mouth daily.     No current facility-administered medications for this visit.    OBJECTIVE: There were no vitals filed for this visit.   There is no height or weight on file to calculate BMI.    ECOG FS:{CHL ONC [id number]  General: Well-developed, well-nourished, no acute distress. Eyes: Pink conjunctiva, anicteric sclera. HEENT: Normocephalic, moist mucous membranes. Lungs: No audible wheezing or coughing. Heart: Regular rate and rhythm. Abdomen: Soft, nontender, no obvious distention. Musculoskeletal: No edema, cyanosis, or clubbing. Neuro: Alert, answering all questions appropriately. Cranial nerves grossly intact. Skin: No rashes or petechiae noted. Psych: Normal affect.  Lymphatics: No cervical, calvicular, axillary or inguinal LAD.   LAB RESULTS:  Lab Results  Component Value Date   NA 136 03/18/2016   K 4.2 03/18/2016   CL 105 03/18/2016   CO2 25 03/18/2016   GLUCOSE 105 (H) 03/18/2016   BUN 19 03/18/2016   CREATININE 0.79 03/18/2016   CALCIUM 8.7 (L) 03/18/2016   PROT 7.5 03/17/2016   ALBUMIN 3.8 03/17/2016   AST 41 03/17/2016   ALT 25 03/17/2016   ALKPHOS 74 03/17/2016   BILITOT 0.2 (L) 03/17/2016   GFRNONAA >60 03/18/2016   GFRAA >60 03/18/2016    Lab Results  Component Value Date   WBC 8.9 07/06/2016   NEUTROABS 5.9 07/06/2016   HGB 14.6 07/06/2016   HCT 42.9 07/06/2016   MCV 80.0 07/06/2016   PLT 199  07/06/2016     STUDIES: CT CHEST WO CONTRAST  Result Date: 05/02/2020 CLINICAL DATA:  Right apical mass. EXAM: CT CHEST WITHOUT CONTRAST TECHNIQUE: Multidetector CT imaging of the chest was performed following the standard protocol without IV contrast. COMPARISON:  MRI brachial plexus 05/01/2020.  CT chest 05/24/2019 FINDINGS: Cardiovascular: Aortic Atherosclerosis (ICD10-I70.0). Mediastinum/Nodes: Mediastinal adenopathy is present. 1.6 cm lower paratracheal node, image 54 series 2. Right hilar adenopathy, difficult to measure separate from the vessels on today's noncontrast exam. Scattered right axillary adenopathy including a 1.0 cm right axillary node on image 34 series 2. Small hiatal hernia. Lungs/Pleura: Scattered bilateral pulmonary nodules compatible with metastatic disease. Large right apical mass invading the chest wall with bony destruction of the right first rib and invasion of the right second rib as well as the right T1 transverse process. The right apical mass is characterized in detail on the brachial plexus MRI. There is a cavitary right middle lobe mass measuring 5.6 by 4.8 cm on image 79 of series 3. Surrounding ground-glass opacity in the lung may reflect pulmonary hemorrhage or associated lymphangitic carcinomatosis or postobstructive pneumonitis. Coarse interstitial accentuation at the right lung apex likewise is suspicious for lymphangitic carcinomatosis. An index nodule in the left lower lobe measures 1.7 by 1.3 cm on image 80 of series 3. Numerous other scattered pulmonary nodules are present of varying sizes. Paraseptal emphysema. Upper Abdomen: Today's noncontrast examination is suboptimal for assessing the solid organs for malignancy. No obvious mass is seen in the upper abdomen. Musculoskeletal: A subcutaneous nodule is present anterior to the left thoracoabdominal wall on image 20 of series 2 measuring 1.4 by 1.1 cm, below the left breast. This was not present on 05/24/2019 and  could represent a metastatic lesion. IMPRESSION: 1. Large right apical mass invading the chest wall, with bony destruction of the right first rib, right second rib, and right T1 transverse process. Surrounding ground-glass opacity in the right middle lobe and right upper lobe favoring lymphangitic carcinomatosis. 2. 5.6 cm cavitary mass in the right middle lobe with surrounding lymphangitic carcinomatosis or postobstructive pneumonitis. 3. Scattered bilateral pulmonary nodules compatible with metastatic disease. 4. A subcutaneous nodule is present anterior to the left thoracoabdominal wall below the left breast, not present on 05/24/2019, and could represent a metastatic lesion. 5. Mediastinal and right hilar adenopathy, compatible with metastatic disease. 6. Emphysema and aortic atherosclerosis. Aortic Atherosclerosis (ICD10-I70.0) and Emphysema (ICD10-J43.9). Electronically Signed   By: Van Clines M.D.   On: 05/02/2020 20:30   NM PET Image Initial (PI) Skull Base To Thigh  Result Date: 05/31/2020 CLINICAL DATA:  Subsequent treatment strategy for non-small cell lung cancer. EXAM: NUCLEAR MEDICINE PET SKULL  BASE TO THIGH TECHNIQUE: 7.5 mCi F-18 FDG was injected intravenously. Full-ring PET imaging was performed from the skull base to thigh after the radiotracer. CT data was obtained and used for attenuation correction and anatomic localization. Fasting blood glucose: 117 mg/dl COMPARISON:  CT chest dated 05/02/2020 FINDINGS: Mediastinal blood pool activity: SUV max 2.0 Liver activity: SUV max NA NECK: 16 mm nodule in the right adrenal gland (series 3/image 54), max SUV 8.4. No hypermetabolic cervical lymphadenopathy. Incidental CT findings: none CHEST: 7.1 cm right apical chest wall mass involving the right anterior 1st and 2nd rib (series 3/image 58), max SUV 17.1. This corresponds to the patient's known primary bronchogenic neoplasm with chest wall involvement. 7.3 cm medial right middle lobe mass  (series 3/image 92), max SUV 17.8. 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 Numerous widespread pulmonary metastases, including a 2.0 cm nodule in the left lower lobe (series 3/image 87), max SUV 7.2. No hypermetabolic mediastinal lymphadenopathy. Right axillary nodal metastases measuring up to 12 mm short axis (series 3/image 74), max SUV 7.7. Incidental CT findings: Atherosclerotic calcifications of the aortic arch/root. Coronary atherosclerosis of the LAD and left circumflex. ABDOMEN/PELVIS: Numerous hepatic metastases, max SUV 15.7 in the right hepatic dome and 10.2 in the lateral segment left hepatic lobe. Possible focal hypermetabolism in the medial spleen, max SUV 4.4, raising concern for a splenic metastasis. No focal hypermetabolism in the pancreas or adrenal glands. Upper abdominal/retroperitoneal lymphadenopathy, including a 14 mm short axis right para-aortic node (series 3/image 145) with max SUV 11.2 and a 13 mm short axis left para-aortic node (series 3/image 152) with max SUV 7.6. Two peritoneal implants along the jejunal mesentery in the left mid abdomen measuring 2.9 and 3.3 cm (series 3/image 167), max SUV 13.9. Additional peritoneal implants, including a dominant 3.7 cm lesion in the left lower pelvis (series 3/image 202), max SUV 18.6. Small volume pelvic ascites. Incidental CT findings: Right lower pole renal cyst. Atherosclerotic calcifications abdominal aorta and branch vessels. Mild sigmoid diverticulosis. SKELETON: Multifocal osseous metastases, including: --Right proximal humerus, max SUV 3.9 --Left posterior 3rd rib, max SUV 4.6 --T7 vertebral body, max SUV 6.2 --T8 posterior spinal process, max SUV 11.0 --Posterior left iliac bone, max SUV 4.4 --Left femoral head, max SUV 14.0 --Right inferior pubic ramus, max SUV 14.3 Soft tissue metastases, including: --Right breast, max SUV 5.3 --Left anterior chest wall, max SUV 10.0 --Right gluteal region,  max SUV 11.9 Incidental CT findings: ORIF of the left shoulder/proximal humerus. Degenerative changes of the visualized thoracolumbar spine. IMPRESSION: Right apical mass with involvement of the chest wall, corresponding to the patient's known primary bronchogenic neoplasm. 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 Additional widespread multifocal pulmonary metastases. Numerous hepatic metastases. Possible splenic metastasis. Nodal and peritoneal metastases. Multifocal osseous and soft tissue metastases. Electronically Signed   By: Julian Hy M.D.   On: 05/31/2020 08:41    ASSESSMENT: Stage IV right lung cancer.  PLAN:    1. Stage IV right lung cancer:  Patient expressed understanding and was in agreement with this plan. She also understands that She can call clinic at any time with any questions, concerns, or complaints.   Cancer Staging No matching staging information was found for the patient.  Lloyd Huger, MD   06/01/2020 10:36 AM

## 2020-06-01 NOTE — ED Notes (Signed)
Report from georgie, rn.

## 2020-06-01 NOTE — ED Notes (Signed)
Pt 88% RA; 94-96% on 2L.

## 2020-06-01 NOTE — ED Triage Notes (Addendum)
Patient presents with SOB since Wednesday. O2 sats 83% RA. Patient placed on 3L O2 in triage with sats of 94%. Daughter with patient. Daughter reports they were around person Sunday 05/26/20 that was diagnosed with covid Thursday. Patient diagnosed with lung cancer X3 weeks ago found by MRI for vertebra. Patient does not have use of right arm X1 month that PCP is aware of. Pfizer booster October 8th. Patient has prescribed narcotics for right arm from PCP

## 2020-06-01 NOTE — Progress Notes (Signed)
Called by nurse and informed patient was tachycardic compared to prior. I did re-evaluate patient and notably , patient has evidence of SVT. EKG was ordered stat. EKG showed evidence of Afib . Trial of IVP of metoprolol will be offered. If no improvement, patient may be started on Diltiazem drip. TSH,ECHO and Cardiology consult pending for a.m

## 2020-06-01 NOTE — ED Notes (Signed)
RN helped pt to the bathroom. While doing so pt desaturated to the 70s and heart rate jumped to the 130s. Pt back in bed, oxygen increased to 3lpm via nasal canula. ER provider notified. Pt is on cardiac, bp and pulse ox monitor. Pulse ox reading now shows greater than 92% on the 3lpm. Pt steady on feet while ambulating with assistance.

## 2020-06-01 NOTE — ED Notes (Signed)
Verbal okay from [place] to give pt snack/drink. Given.

## 2020-06-01 NOTE — ED Notes (Addendum)
See triage note. Pt in NAD. Skin dry; resp reg/unlabored. Denies COPD or asthma history. Reports has lung cancer. Denies home oxygen. States since yesterday started becoming SOB when walking at home. Denies fever, N/V/D, abd pain, HA, CP. Mild wheezing noted at times.

## 2020-06-01 NOTE — ED Notes (Signed)
EDP Archie Balboa to bedside. Visitor remains at bedside.

## 2020-06-01 NOTE — ED Notes (Signed)
Dr. Phineas Inches notified of tachycardia, order for lr bolus received, will hang when other fluids are complete.

## 2020-06-02 ENCOUNTER — Inpatient Hospital Stay (HOSPITAL_COMMUNITY)
Admit: 2020-06-02 | Discharge: 2020-06-02 | Disposition: A | Discharge status: Home / Self Care | Payer: Medicare PPO | Attending: Internal Medicine | Admitting: Internal Medicine

## 2020-06-02 ENCOUNTER — Other Ambulatory Visit: Payer: Self-pay

## 2020-06-02 ENCOUNTER — Encounter: Payer: Self-pay | Admitting: Internal Medicine

## 2020-06-02 ENCOUNTER — Inpatient Hospital Stay: Payer: Medicare PPO

## 2020-06-02 DIAGNOSIS — R9431 Abnormal electrocardiogram [ECG] [EKG]: Secondary | ICD-10-CM | POA: Diagnosis not present

## 2020-06-02 DIAGNOSIS — R778 Other specified abnormalities of plasma proteins: Secondary | ICD-10-CM | POA: Diagnosis not present

## 2020-06-02 DIAGNOSIS — J9691 Respiratory failure, unspecified with hypoxia: Secondary | ICD-10-CM

## 2020-06-02 DIAGNOSIS — I340 Nonrheumatic mitral (valve) insufficiency: Secondary | ICD-10-CM | POA: Diagnosis not present

## 2020-06-02 DIAGNOSIS — I361 Nonrheumatic tricuspid (valve) insufficiency: Secondary | ICD-10-CM

## 2020-06-02 DIAGNOSIS — I10 Essential (primary) hypertension: Secondary | ICD-10-CM | POA: Diagnosis not present

## 2020-06-02 DIAGNOSIS — J189 Pneumonia, unspecified organism: Secondary | ICD-10-CM | POA: Diagnosis not present

## 2020-06-02 LAB — CBC WITH DIFFERENTIAL/PLATELET
Abs Immature Granulocytes: 1.65 10*3/uL — ABNORMAL HIGH (ref 0.00–0.07)
Basophils Absolute: 0.4 10*3/uL — ABNORMAL HIGH (ref 0.0–0.1)
Basophils Relative: 1 %
Eosinophils Absolute: 6.1 10*3/uL — ABNORMAL HIGH (ref 0.0–0.5)
Eosinophils Relative: 17 %
HCT: 47.9 % — ABNORMAL HIGH (ref 36.0–46.0)
Hemoglobin: 15.2 g/dL — ABNORMAL HIGH (ref 12.0–15.0)
Immature Granulocytes: 5 %
Lymphocytes Relative: 9 %
Lymphs Abs: 3.3 10*3/uL (ref 0.7–4.0)
MCH: 28.3 pg (ref 26.0–34.0)
MCHC: 31.7 g/dL (ref 30.0–36.0)
MCV: 89 fL (ref 80.0–100.0)
Monocytes Absolute: 4 10*3/uL — ABNORMAL HIGH (ref 0.1–1.0)
Monocytes Relative: 11 %
Neutro Abs: 20.9 10*3/uL — ABNORMAL HIGH (ref 1.7–7.7)
Neutrophils Relative %: 57 %
Platelets: 213 10*3/uL (ref 150–400)
RBC: 5.38 MIL/uL — ABNORMAL HIGH (ref 3.87–5.11)
RDW: 17.1 % — ABNORMAL HIGH (ref 11.5–15.5)
Smear Review: NORMAL
WBC: 36.3 10*3/uL — ABNORMAL HIGH (ref 4.0–10.5)
nRBC: 0.3 % — ABNORMAL HIGH (ref 0.0–0.2)

## 2020-06-02 LAB — ECHOCARDIOGRAM COMPLETE
AR max vel: 1.74 cm2
AV Peak grad: 5.3 mmHg
Ao pk vel: 1.15 m/s
Area-P 1/2: 4.1 cm2
Height: 62 in
S' Lateral: 3 cm
Weight: 2352 oz

## 2020-06-02 LAB — BASIC METABOLIC PANEL
Anion gap: 10 (ref 5–15)
BUN: 15 mg/dL (ref 8–23)
CO2: 26 mmol/L (ref 22–32)
Calcium: 8.8 mg/dL — ABNORMAL LOW (ref 8.9–10.3)
Chloride: 97 mmol/L — ABNORMAL LOW (ref 98–111)
Creatinine, Ser: 0.63 mg/dL (ref 0.44–1.00)
GFR, Estimated: >60 mL/min (ref >60)
Glucose, Bld: 144 mg/dL — ABNORMAL HIGH (ref 70–99)
Potassium: 4.2 mmol/L (ref 3.5–5.1)
Sodium: 133 mmol/L — ABNORMAL LOW (ref 135–145)

## 2020-06-02 LAB — URINALYSIS, COMPLETE (UACMP) WITH MICROSCOPIC
Bacteria, UA: NONE SEEN
Bilirubin Urine: NEGATIVE
Glucose, UA: NEGATIVE mg/dL
Hgb urine dipstick: NEGATIVE
Ketones, ur: 5 mg/dL — AB
Leukocytes,Ua: NEGATIVE
Nitrite: NEGATIVE
Protein, ur: NEGATIVE mg/dL
Specific Gravity, Urine: 1.044 — ABNORMAL HIGH (ref 1.005–1.030)
pH: 5 (ref 5.0–8.0)

## 2020-06-02 LAB — HEMOGLOBIN A1C
Hgb A1c MFr Bld: 5.7 % — ABNORMAL HIGH (ref 4.8–5.6)
Mean Plasma Glucose: 116.89 mg/dL

## 2020-06-02 LAB — STREP PNEUMONIAE URINARY ANTIGEN: Strep Pneumo Urinary Antigen: NEGATIVE

## 2020-06-02 LAB — PROCALCITONIN: Procalcitonin: <0.1 ng/mL

## 2020-06-02 LAB — LACTIC ACID, PLASMA: Lactic Acid, Venous: 1.8 mmol/L (ref 0.5–1.9)

## 2020-06-02 MED ORDER — ALBUTEROL SULFATE (2.5 MG/3ML) 0.083% IN NEBU
2.5000 mg | INHALATION_SOLUTION | RESPIRATORY_TRACT | Status: DC | PRN
  Administered 2020-06-03 – 2020-06-05 (×4): 2.5 mg via RESPIRATORY_TRACT
  Filled 2020-06-02 (×4): qty 3

## 2020-06-02 MED ORDER — METOPROLOL TARTRATE 5 MG/5ML IV SOLN
2.5000 mg | Freq: Once | INTRAVENOUS | Status: AC
  Administered 2020-06-02: 2.5 mg via INTRAVENOUS
  Filled 2020-06-02: qty 5

## 2020-06-02 MED ORDER — SODIUM CHLORIDE 0.9 % IV SOLN
2.0000 g | INTRAVENOUS | Status: DC
  Administered 2020-06-02 – 2020-06-04 (×3): 2 g via INTRAVENOUS
  Filled 2020-06-02 (×4): qty 20

## 2020-06-02 MED ORDER — IPRATROPIUM-ALBUTEROL 0.5-2.5 (3) MG/3ML IN SOLN
3.0000 mL | Freq: Four times a day (QID) | RESPIRATORY_TRACT | Status: DC
  Administered 2020-06-02 – 2020-06-03 (×4): 3 mL via RESPIRATORY_TRACT
  Filled 2020-06-02 (×3): qty 3

## 2020-06-02 NOTE — ED Notes (Signed)
Pt tolerating bipap, moved up in bed again, head of bed lowered slightly per pt request. Pt able to tolerate.

## 2020-06-02 NOTE — ED Notes (Signed)
Echo at bedside

## 2020-06-02 NOTE — ED Notes (Signed)
Report to reina, rn.  

## 2020-06-02 NOTE — ED Notes (Addendum)
RT at bedside.

## 2020-06-02 NOTE — ED Notes (Signed)
Contacted attending listed on treatment team 8, Rinka Pahwani, to inquire about diet order and discontinuing Bipap order.

## 2020-06-02 NOTE — Progress Notes (Signed)
Patient has home cpap. Unit appears to be in proper working order. No rips or tears noted. o2 6 liters titrated into circuit. Found patient on nrb mask. Patient tolerating interventions well.

## 2020-06-02 NOTE — ED Notes (Signed)
resp status improved with bipap. Pt resting.

## 2020-06-02 NOTE — ED Notes (Signed)
Called RT. They will come see pt and eval need for bipap vs nonrebreather mask.

## 2020-06-02 NOTE — ED Notes (Signed)
Daughter at bedside. Pt given bites of applesauce per pt/daughter request. Daughter concerned because she "hasn't eaten anything since Friday night" (1.5 days ago). Explained concerns with pt being on Bipap. Pt holding nonrebreather mask over face while eats, SPO2 remains 98%.

## 2020-06-02 NOTE — ED Notes (Signed)
Continuing to monitor pt on nrb. Pt with orthopnea noted, pt with pox on nrb 94-100%. Hr increasing, page sent to [employer] via telephone to notify.

## 2020-06-02 NOTE — ED Notes (Signed)
Pt with pox in low 80s on her personal cpap. Pt taken off cpap and trialed on nasal cannula with pox to mid 80s. Pt placed back on nrb at 10lpm with rebound pox into high 90s after 5 minutes. Secure chat message sent to dr. Phineas Inches to notify.

## 2020-06-02 NOTE — Consult Note (Addendum)
Cardiology Consultation:   Patient ID: Kim Newman MRN: [id number]; DOB: [birth year]  Admit date: 06/01/2020 Date of Consult: 06/02/2020  Primary Care Provider: Baxter Hire, MD [facility] Cardiologist: [place] Electrophysiologist:  None    Patient Profile:   Kim Newman is a 87 y.o. female with a hx of remote tobacco use, HTN, OSA, GERD, lung nodule followed by oncology and recent diagnosis of lung cancer who is being seen today for the evaluation of possible afib and elevated troponin at the request of Dr. Phineas Inches.  History of Present Illness:   Kim Newman has no significant prior cardiac history. No prior ischemic evaluation. Remote tobacco use. No alcohol or drug history. She lives with her daughter and she uses a cane to ambulate. She is able to perform ADLs well enough. No family hx of cardiac disease. No history of diabetes.   The patient presented to the ER 06/03/19 for shortness of breath and weakness for the last 3 days. Also developed cough a few days ago. Symptoms were progressive. She denies fever or chills. No chest pain, lower leg edema, orthopnea. No nausea or vomiting.  In the ED BP 93/56, pulse 100, afebrile, 83% O2. Labs showed sodium 133, potassium 3.9, creatinine 0/75, BUN 19, WBC 30.1, Hgb 14.9, HS trop 59>49. Lactic acid 1.2, procal wnl. D-dimer 3.41. Respiratory panel negative. BC drawn. EKG showed SR with 1st degree AV block and no ischemic changes. CXR showed possible mild acute ground-glass infiltrates, known ILD. CT chest negative for PE, increase in right apical consolidation, possible pna vs progression of lymphangitic carcinomatosis, aortic atherosclerosis.    Past Medical History:  Diagnosis Date  . Arthritis   . Cataract   . Cervical cancer ([place]) 1997  . GERD (gastroesophageal reflux disease)   . Hypertension   . Lung cancer ([place])   . Macular degeneration   . Obstructive sleep apnea   . Sleep apnea     Past Surgical  History:  Procedure Laterality Date  . ABDOMINAL HYSTERECTOMY  1997  . BREAST BIOPSY Left 2002   core   . COLONOSCOPY  2012     Home Medications:  Prior to Admission medications   Medication Sig Start Date End Date Taking? Authorizing Provider  ADVAIR DISKUS 100-50 MCG/DOSE AEPB Inhale 2 puffs into the lungs 2 (two) times daily.  03/04/16   [provider]  aspirin EC 81 MG tablet Take 1 tablet by mouth 2 (two) times daily.     [provider]  Calcium Carbonate-Vit D-Min (CALCIUM 1200 PO) Take 1 tablet by mouth 2 (two) times daily.    [provider]  cetirizine (ZYRTEC) 10 MG tablet Take 1 tablet by mouth daily.    [provider]  Cyanocobalamin 1000 MCG TBCR Take 2 tablets by mouth daily.    [provider]  DYMISTA 137-50 MCG/ACT SUSP Place 1 spray into the nose 2 (two) times daily.  02/10/16   [provider]  ferrous sulfate 325 (65 FE) MG tablet Take 1 tablet (325 mg total) by mouth 2 (two) times daily with a meal. 03/18/16   Sainani, Belia Heman, MD  losartan (COZAAR) 50 MG tablet Take 1 tablet by mouth daily. 02/14/16   [provider]  Multiple Vitamin (MULTIVITAMIN) tablet Take 1 tablet by mouth daily.    [provider]  Multiple Vitamins-Minerals (PRESERVISION AREDS 2 PO) Take 1 tablet by mouth 2 (two) times daily.     [provider]  Omega-3 Fatty Acids (FISH OIL PO) Take 1 capsule by mouth daily.    [provider]  omeprazole (PRILOSEC) 40 MG capsule Take 1 capsule by mouth daily.    [provider]  telmisartan (MICARDIS) 40 MG tablet  02/23/20   [provider]  vitamin E 180 MG (400 UNITS) capsule Take 400 Units by mouth daily.    [provider]    Inpatient Medications: Scheduled Meds: . aspirin EC  81 mg Oral BID  . fluticasone  1 spray Each Nare BID   And  . azelastine  1 spray Each Nare BID  . enoxaparin (LOVENOX) injection  40 mg Subcutaneous Q24H   . ferrous sulfate  325 mg Oral BID WC  . loratadine  10 mg Oral Daily  . losartan  50 mg Oral Daily  . metoprolol tartrate  25 mg Oral BID  . multivitamin with minerals  1 tablet Oral Daily  . pantoprazole  40 mg Oral Daily  . vitamin B-12  1,000 mcg Oral BID   Continuous Infusions: . azithromycin    . cefTRIAXone (ROCEPHIN)  IV     PRN Meds: ipratropium-albuterol  Allergies:    Allergies  Allergen Reactions  . Statins   . Strawberry (Diagnostic)     Social History:   Social History   Socioeconomic History  . Marital status: Widowed    Spouse name: Not on file  . Number of children: Not on file  . Years of education: Not on file  . Highest education level: Not on file  Occupational History  . Not on file  Tobacco Use  . Smoking status: Former Smoker    Types: Cigarettes  . Smokeless tobacco: Never Used  Substance and Sexual Activity  . Alcohol use: Yes  . Drug use: No  . Sexual activity: Not on file  Other Topics Concern  . Not on file  Social History Narrative  . Not on file   Social Determinants of Health   Financial Resource Strain: Not on file  Food Insecurity: Not on file  Transportation Needs: Not on file  Physical Activity: Not on file  Stress: Not on file  Social Connections: Not on file  Intimate Partner Violence: Not on file    Family History:   Family History  Problem Relation Age of Onset  . Breast cancer Neg Hx      ROS:  Please see the history of present illness.  All other ROS reviewed and negative.     Physical Exam/Data:   Vitals:   06/02/20 0800 06/02/20 0910 06/02/20 1000 06/02/20 1100  BP: 116/60 134/63 (!) 115/54 123/69  Pulse:   98 (!) 102  Resp: 20 (!) 24 (!) 24 (!) 22  Temp:      TempSrc:      SpO2:  100% 100% 100%  Weight:      Height:        Intake/Output Summary (Last 24 hours) at 06/02/2020 1127 Last data filed at 06/02/2020 0108 Gross per 24 hour  Intake 2254.11 ml  Output -  Net 2254.11 ml   Last 3  Weights 06/01/2020 05/09/2020 07/08/2016  Weight (lbs) 147 lb 149 lb 146 lb 6.2 oz  Weight (kg) 66.679 kg 67.586 kg 66.4 kg     Body mass index is 26.89 kg/m.  General:  Elderly WF on bipap HEENT: normal Lymph: no adenopathy Neck: no JVD Endocrine:  No thryomegaly Vascular: No carotid bruits; FA pulses 2+ bilaterally without bruits  Cardiac:  normal S1, S2; RRR; no murmur  Lungs: +wheezing,+ rhonchi + rales  Abd: soft, nontender, no hepatomegaly  Ext: no edema Musculoskeletal:  No deformities, BUE and BLE strength normal and equal Skin: warm and dry  Neuro:  CNs 2-12 intact, no focal abnormalities noted Psych:  Normal affect   EKG:  The EKG was personally reviewed and demonstrates:  NSR, 1st degree AV block, 96bpm, nonspecific T wave changes Telemetry:  Telemetry was personally reviewed and demonstrates:  SR with HR now in the 70s but it did jump up to 120-130s however it looks like ST, rhythm is regular  Relevant CV Studies:  Echo ordered  Laboratory Data:  High Sensitivity Troponin:   Recent Labs  Lab 06/01/20 1223 06/01/20 1808  TROPONINIHS 59* 49*     Chemistry Recent Labs  Lab 06/01/20 1223 06/02/20 0320  NA 133* 133*  K 3.9 4.2  CL 97* 97*  CO2 24 26  GLUCOSE 142* 144*  BUN 19 15  CREATININE 0.75 0.63  CALCIUM 9.1 8.8*  GFRNONAA >60 >60  ANIONGAP 12 10    No results for input(s): PROT, ALBUMIN, AST, ALT, ALKPHOS, BILITOT in the last 168 hours. Hematology Recent Labs  Lab 06/01/20 1223 06/02/20 0320  WBC 30.1* 36.3*  RBC 5.26* 5.38*  HGB 14.9 15.2*  HCT 45.7 47.9*  MCV 86.9 89.0  MCH 28.3 28.3  MCHC 32.6 31.7  RDW 16.7* 17.1*  PLT 194 213   BNPNo results for input(s): BNP, PROBNP in the last 168 hours.  DDimer  Recent Labs  Lab 06/01/20 1223  DDIMER 3.41*     Radiology/Studies:  DG Chest 1 View  Result Date: 06/02/2020 CLINICAL DATA:  Shortness of breath. EXAM: CHEST  1 VIEW COMPARISON:  Radiograph and CT yesterday.  PET CT 3 days ago  FINDINGS: Lower lung volumes from prior exam. Chronic volume loss in the right hemithorax. Pleuroparenchymal opacity at the right lung apex is well as diffuse irregular opacities throughout the right lung, progressive at the right lung base. Left basilar pulmonary pulmonary nodules on CT are not well-defined by radiograph. Chronic interstitial coarsening throughout the left hemithorax. No pneumothorax or significant pleural effusion.2 bony destructive changes involving the right first and second rib were better seen on prior CT. Surgical hardware in the left proximal humerus. IMPRESSION: 1. Progressive irregular right lung opacities particularly at the bases, favor atelectasis, infection could have a similar appearance. 2. Unchanged volume loss in the right hemithorax with pleuroparenchymal opacity at the apex corresponding to known malignancy. Underlying chronic interstitial lung changes. Many of the known pulmonary nodules are not well seen by radiograph. 3. Known osseous destructive changes about the right first and second ribs. Electronically Signed   By: Keith Rake M.D.   On: 06/02/2020 03:57   DG Chest 2 View  Result Date: 06/01/2020 CLINICAL DATA:  Shortness of breath. Known exposure to COVID-19 this week. EXAM: CHEST - 2 VIEW COMPARISON:  CT imaging from May 22, 2020 FINDINGS: The known right apical mass is appreciated. The known right middle lobe mass is best appreciated on the lateral view. Known interstitial lung disease is identified. Given the significant chronic changes in the lungs and the lack of a comparison chest x-ray, it would be difficult to exclude mild acute on chronic ground-glass infiltrates. IMPRESSION: Given the significant chronic changes in the lungs and the lack of a comparison chest x-ray, it would be difficult to exclude mild acute on chronic ground-glass infiltrates. Known malignancy on  the right as above. Known interstitial lung disease. Electronically Signed   By:  Dorise Bullion III M.D   On: 06/01/2020 13:01   CT Angio Chest PE W and/or Wo Contrast  Result Date: 06/01/2020 CLINICAL DATA:  87 year old female with shortness of breath. Right apical mass. EXAM: CT ANGIOGRAPHY CHEST WITH CONTRAST TECHNIQUE: Multidetector CT imaging of the chest was performed using the standard protocol during bolus administration of intravenous contrast. Multiplanar CT image reconstructions and MIPs were obtained to evaluate the vascular anatomy. CONTRAST:  58mL OMNIPAQUE IOHEXOL 350 MG/ML SOLN COMPARISON:  Chest CT dated 05/02/2020. FINDINGS: Cardiovascular: There is no cardiomegaly or pericardial effusion. Coronary vascular calcification. There is advanced atherosclerotic calcification the thoracic aorta. No aneurysmal dilatation or dissection. Evaluation of the pulmonary arteries is limited due to respiratory motion artifact. No large or central pulmonary artery embolus identified. Mediastinum/Nodes: Mildly enlarged right hilar lymph node measures 12 mm. There is a small hiatal hernia. No mediastinal fluid collection. Lungs/Pleura: Right apical consolidation/mass as seen previously. Multiple bilateral pulmonary nodules consistent with metastatic disease. Interval increase in the size of the nodular with prosthesis since the prior CT. There is an area of consolidation involving the right middle lobe progressed since the prior CT. There is increased interstitial prominence and diffuse ground-glass density throughout the upper lobes and right middle lobe compared to prior CT which may represent progression of lymphangitic carcinomatosis or pneumonia. Clinical correlation is recommended. There is no pleural effusion pneumothorax. The central airways are patent. Upper Abdomen: Faint hepatic hypodense lesions most consistent with metastatic disease. Musculoskeletal: Erosive changes of the right first and second ribs secondary to right apical mass. Review of the MIP images confirms the above  findings. IMPRESSION: 1. No CT evidence of central pulmonary artery embolus. 2. Right apical consolidation/mass as seen previously. Interval increase in the size of the metastatic disease compared to prior CT. 3. Interval increase in the interstitial prominence and diffuse ground-glass density throughout the upper lobes and right middle lobe compared to the prior CT which may represent progression of lymphangitic carcinomatosis or pneumonia. 4. Mildly enlarged right hilar lymph node. 5. Hepatic metastatic disease. 6. Aortic Atherosclerosis (ICD10-I70.0). Electronically Signed   By: Anner Crete M.D.   On: 06/01/2020 21:56   NM PET Image Initial (PI) Skull Base To Thigh  Result Date: 05/31/2020 CLINICAL DATA:  Subsequent treatment strategy for non-small cell lung cancer. EXAM: NUCLEAR MEDICINE PET SKULL BASE TO THIGH TECHNIQUE: 7.5 mCi F-18 FDG was injected intravenously. Full-ring PET imaging was performed from the skull base to thigh after the radiotracer. CT data was obtained and used for attenuation correction and anatomic localization. Fasting blood glucose: 117 mg/dl COMPARISON:  CT chest dated 05/02/2020 FINDINGS: Mediastinal blood pool activity: SUV max 2.0 Liver activity: SUV max NA NECK: 16 mm nodule in the right adrenal gland (series 3/image 54), max SUV 8.4. No hypermetabolic cervical lymphadenopathy. Incidental CT findings: none CHEST: 7.1 cm right apical chest wall mass involving the right anterior 1st and 2nd rib (series 3/image 58), max SUV 17.1. This corresponds to the patient's known primary bronchogenic neoplasm with chest wall involvement. 7.3 cm medial right middle lobe mass (series 3/image 92), max SUV 17.8. 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 Numerous widespread pulmonary metastases, including a 2.0 cm nodule in the left lower lobe (series 3/image 87), max SUV 7.2. No hypermetabolic mediastinal lymphadenopathy. Right axillary nodal  metastases measuring up to 12 mm short axis (series 3/image 74), max SUV  7.7. Incidental CT findings: Atherosclerotic calcifications of the aortic arch/root. Coronary atherosclerosis of the LAD and left circumflex. ABDOMEN/PELVIS: Numerous hepatic metastases, max SUV 15.7 in the right hepatic dome and 10.2 in the lateral segment left hepatic lobe. Possible focal hypermetabolism in the medial spleen, max SUV 4.4, raising concern for a splenic metastasis. No focal hypermetabolism in the pancreas or adrenal glands. Upper abdominal/retroperitoneal lymphadenopathy, including a 14 mm short axis right para-aortic node (series 3/image 145) with max SUV 11.2 and a 13 mm short axis left para-aortic node (series 3/image 152) with max SUV 7.6. Two peritoneal implants along the jejunal mesentery in the left mid abdomen measuring 2.9 and 3.3 cm (series 3/image 167), max SUV 13.9. Additional peritoneal implants, including a dominant 3.7 cm lesion in the left lower pelvis (series 3/image 202), max SUV 18.6. Small volume pelvic ascites. Incidental CT findings: Right lower pole renal cyst. Atherosclerotic calcifications abdominal aorta and branch vessels. Mild sigmoid diverticulosis. SKELETON: Multifocal osseous metastases, including: --Right proximal humerus, max SUV 3.9 --Left posterior 3rd rib, max SUV 4.6 --T7 vertebral body, max SUV 6.2 --T8 posterior spinal process, max SUV 11.0 --Posterior left iliac bone, max SUV 4.4 --Left femoral head, max SUV 14.0 --Right inferior pubic ramus, max SUV 14.3 Soft tissue metastases, including: --Right breast, max SUV 5.3 --Left anterior chest wall, max SUV 10.0 --Right gluteal region, max SUV 11.9 Incidental CT findings: ORIF of the left shoulder/proximal humerus. Degenerative changes of the visualized thoracolumbar spine. IMPRESSION: Right apical mass with involvement of the chest wall, corresponding to the patient's known primary bronchogenic neoplasm. 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 Additional widespread multifocal pulmonary metastases. Numerous hepatic metastases. Possible splenic metastasis. Nodal and peritoneal metastases. Multifocal osseous and soft tissue metastases. Electronically Signed   By: Julian Hy M.D.   On: 05/31/2020 08:41     Assessment and Plan:   Respiratory failure with hypoxia Underlying suspected lung cancer/RUL mass CAP with Sepsis - presents with progressive sob requiring bipap - Imaging showed possible pna vs lymphangitic carcinomatosis  - D-dimer elevated but CT negative for PE - afebrile with leukocytosis - BC drawn - patient follows with oncology as OP for right upper lobe mass - abx per IM  Elevated troponin - Hs trop 59>49 - No anginal symptoms - EKG shows SR with no ischemic changes.  - echo as above - RF for CAD include HTN and HLD. Check A1C and lipid panel.  - CT chest this admission showed coronary vascular calcification - Given patient has no symptoms would not pursue ischemic work-up at this time.  - she is on Aspirin 81mg  daily  Possible arrhythmia - possible afib  - no evidence of this on EKG. Tele shows SR/ST. She has first degree AV block which can make it difficult to discern p wave at the faster rates however rhythm is regular. She is currently in SR.  - started on metoprolol for rate control - Echo ordered - TSH - continue telemetry.   HTN - losartan and metoprolol - BP, most recent 123/69   For questions or updates, please contact [facility] Please consult [email or web address] for contact info under    Signed, Cadence Ninfa Meeker, PA-C  06/02/2020 11:27 AM   History and all data above reviewed.  Patient examined.  I agree with the findings as above.  The patient has no past cardiac history.  She presents with progressive dyspnea leading to SOB at rest.  She has had no cardiac symptoms  such as chest pain, palpitations, PND or orthopnea.  She has had no cough  fevers or chills.  We are called because of possible atrial fib.  However, my review of the EKG and telemetry does not suggest this.  She has some tachycardia and rare ectopy.  The patient exam reveals COR:RRR  ,  Lungs: Decreased breath sounds with coarse crackles, no wheezing  ,  Abd: Positive bowel sounds, no rebound no guarding, Ext No edema  .  All available labs, radiology testing, previous records reviewed. Agree with documented assessment and plan. SOB:  She does not appear to have CHF.  Likely a primary pulmonary process.  No further cardiac work up other than echo as below.  Question arrhythmia:  I do not see evidence of any atrial fib.  Follow on telemetry.   Elevated troponin: I suspect that this secondary to the acute pulmonary process.  I am not suspecting ACS.   Jeneen Rinks Caasi Giglia  3:43 PM  06/02/2020

## 2020-06-02 NOTE — ED Notes (Signed)
Attending contacted per RT request to see if Bipap order can be discontinued at this time.

## 2020-06-02 NOTE — ED Notes (Signed)
Pt with wet bed. Pt cleansed, sheets changed, pure wick place on low suction. Pt with increase in work of breathing prior to this with pox in the upper 70s and increased work of breathing noted with cyanosis noted to lips. Pt not able to rebound quickly with increase in nasal cannula oxygen. Pt placed on NRB mask briefly to increase oxygenation. Pt with rebound after approx 3 minutes on nrb with improvement in resp status and cessation of cyanosis. Head of bed elevated to 60 degrees to promote oxygenation during episode.

## 2020-06-02 NOTE — ED Notes (Signed)
RT here to place pt on home cpap.

## 2020-06-02 NOTE — Progress Notes (Signed)
*  PRELIMINARY RESULTS* Echocardiogram 2D Echocardiogram has been performed.  Emani Taussig C Yolinda Duerr 06/02/2020, 1:10 PM

## 2020-06-02 NOTE — ED Notes (Signed)
Pt tolerating bipap well.  °

## 2020-06-02 NOTE — ED Notes (Signed)
Pt with increased work of breathing, hr and resp rate increasing. Multiple pages placed to hospitalist service without response. Dr. Alfred Levins ed md aware and attempting to notify hospitalist service as well. Per dr. Jerrye Beavers place pt on bipap.

## 2020-06-02 NOTE — Progress Notes (Signed)
PROGRESS NOTE    Kim Newman  ZRA:[id number] DOB: [birth year] DOA: 06/01/2020 PCP: Baxter Hire, MD   Brief Narrative:  Kim Newman is an 87 y.o. female.  Patient is a pleasant elderly female with remote history of tobacco smoking, history of hypertension, obstructive sleep apnea, history of lung nodules (incidental finding 4 years ago) with recent diagnosis of lung cancer a week prior to Christmas who was recommended radiation treatment.  As per patient, she has been in her usual state of health but over the past week, has had worsening shortness of breath with associated dry cough.  Symptoms have been associated with easy fatigability and generalized weakness has decided to come to the emergency room today for further evaluation and management.She is fully vaccinated number listed for the Covid vaccine.    ED course: On presentation, patient was noted to be hypoxic requiring oxygen supplementation.  WBC was noted to be markedly elevated at 30K.  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   Assessment & Plan:   Acute hypoxemic respiratory failure in the setting of community-acquired pneumonia and underlying suspected lung cancer: -Patient presented with shortness of breath and requiring BiPAP currently. -Reviewed chest x-ray and CTA chest. -She is afebrile with markedly elevated white count of 36,000 -Continue Rocephin and azithromycin -Urine strep antigen and Legionella antigen: Pending  Sepsis in the setting of pneumonia: -Patient met sepsis criteria on admission with tachycardia, tachypnea, leukocytosis, lactic acid: 1.8, COVID-19 negative. -Continue IV antibiotics as above.  UA is pending.  History of right upper lobe mass: -Sees Dr. Donella Stade oncology outpatient.  Unclear underlying etiology patient is yet to have a biopsy.  She has a scheduled appointment with Dr.  Donella Stade on 1/12 for possible biopsy and discuss about the treatment options.  Elevated troponin: -Initial troponin: 59 trended down to 49.  Patient denies active chest pain.  Reviewed EKG.  Likely in the setting of demand ischemia due to acute hypoxemic respiratory failure.  Trend troponin.  Echo is pending.  New onset A. fib with RVR: -Reviewed EKG. no previous history of A. fib. -Echo and TSH is pending.  Monitor heart rate closely on telemetry.  Continue metoprolol. -We will consult cardiology  Hypertension: Blood pressure is stable. -Continue losartan and metoprolol  Iron deficiency anemia anemia: H&H is stable.  Continue ferrous sulfate  GERD: Continue PPI  Obstructive sleep apnea: On CPAP at home.  History of pulmonary fibrosis: Followed by pulmonology outpatient.  DVT prophylaxis: Lovenox Code Status: DNR-confirmed with the daughter Family Communication: None present at bedside.  Plan of care discussed with patient in length and he verbalized understanding and agreed with it.  I called patient's daughter and discussed plan of care and she verbalized understanding.  Disposition Plan: TBD   Consultants:   cardiology  Procedures:   CTA  Antimicrobials:   Azithromycin  Rocephin   Status is: Inpatient  Dispo: The patient is from: Home              Anticipated d/c is to: Home              Anticipated d/c date is: 3 days              Patient currently is not medically stable to d/c.    Subjective: Patient seen and examined.  On BiPAP.  Tells me that her breathing is better.  Denies chest pain and wishes  to get out of bed.  Objective: Vitals:   06/02/20 0700 06/02/20 0703 06/02/20 0800 06/02/20 0910  BP: 129/68  116/60 134/63  Pulse: 95 97    Resp: (!) _0 (!) 24  Temp:      TempSrc:      SpO2: 100% 99%  100%  Weight:      Height:        Intake/Output Summary (Last 24 hours) at 06/02/2020 1014 Last data filed at 06/02/2020 0108 Gross per 24 hour   Intake 2254.11 ml  Output --  Net 2254.11 ml   Filed Weights   06/01/20 1216  Weight: 66.7 kg    Examination:  General exam: Appears calm and comfortable on bipap Respiratory system: Coarse breath sounds noted.  No wheezing. Cardiovascular system: S1 & S2 heard, RRR. No JVD, murmurs, rubs, gallops or clicks. No pedal edema. Gastrointestinal system: Abdomen is nondistended, soft and nontender. No organomegaly or masses felt. Normal bowel sounds heard. Central nervous system: Alert and oriented. No focal neurological deficits. Extremities: Symmetric 5 x 5 power. Skin: No rashes, lesions or ulcers Psychiatry: Judgement and insight appear normal. Mood & affect appropriate.    Data Reviewed: I have personally reviewed following labs and imaging studies  CBC: Recent Labs  Lab 06/01/20 1223 06/02/20 0320  WBC 30.1* 36.3*  NEUTROABS  --  20.9*  HGB 14.9 15.2*  HCT 45.7 47.9*  MCV 86.9 89.0  PLT 194 185   Basic Metabolic Panel: Recent Labs  Lab 06/01/20 1223 06/02/20 0320  NA 133* 133*  K 3.9 4.2  CL 97* 97*  CO2 24 26  GLUCOSE 142* 144*  BUN 19 15  CREATININE 0.75 0.63  CALCIUM 9.1 8.8*   GFR: Estimated Creatinine Clearance: 41.8 mL/min (by C-G formula based on SCr of 0.63 mg/dL). Liver Function Tests: No results for input(s): AST, ALT, ALKPHOS, BILITOT, PROT, ALBUMIN in the last 168 hours. No results for input(s): LIPASE, AMYLASE in the last 168 hours. No results for input(s): AMMONIA in the last 168 hours. Coagulation Profile: Recent Labs  Lab 06/01/20 1223  INR 1.1   Cardiac Enzymes: No results for input(s): CKTOTAL, CKMB, CKMBINDEX, TROPONINI in the last 168 hours. BNP (last 3 results) No results for input(s): PROBNP in the last 8760 hours. HbA1C: No results for input(s): HGBA1C in the last 72 hours. CBG: Recent Labs  Lab 05/30/20 1413  GLUCAP 117*   Lipid Profile: No results for input(s): CHOL, HDL, LDLCALC, TRIG, CHOLHDL, LDLDIRECT in the last  72 hours. Thyroid Function Tests: No results for input(s): TSH, T4TOTAL, FREET4, T3FREE, THYROIDAB in the last 72 hours. Anemia Panel: No results for input(s): VITAMINB12, FOLATE, FERRITIN, TIBC, IRON, RETICCTPCT in the last 72 hours. Sepsis Labs: Recent Labs  Lab 06/01/20 2009 06/02/20 0320  PROCALCITON 0.15  --   LATICACIDVEN 1.2 1.8    Recent Results (from the past 240 hour(s))  Blood culture (routine x 2)     Status: None (Preliminary result)   Collection Time: 06/01/20  7:02 PM   Specimen: BLOOD  Result Value Ref Range Status   Specimen Description BLOOD LEFT ANTECUBITAL  Final   Special Requests   Final    BOTTLES DRAWN AEROBIC AND ANAEROBIC Blood Culture adequate volume   Culture   Final    NO GROWTH < 12 HOURS Performed at [facility], [address], [place], [place] [address]    Report Status PENDING  Incomplete  Blood culture (routine x 2)  Status: None (Preliminary result)   Collection Time: 06/01/20  8:09 PM   Specimen: BLOOD  Result Value Ref Range Status   Specimen Description BLOOD RIGHT ANTECUBITAL  Final   Special Requests   Final    BOTTLES DRAWN AEROBIC AND ANAEROBIC Blood Culture results may not be optimal due to an excessive volume of blood received in culture bottles   Culture   Final    NO GROWTH < 12 HOURS Performed at [facility], [address], [place], [place] [address]    Report Status PENDING  Incomplete  Resp Panel by RT-PCR (Flu A&B, Covid) Nasopharyngeal Swab     Status: None   Collection Time: 06/01/20  8:55 PM   Specimen: Nasopharyngeal Swab; Nasopharyngeal(NP) swabs in vial transport medium  Result Value Ref Range Status   SARS Coronavirus 2 by RT PCR NEGATIVE NEGATIVE Final    Comment: (NOTE) SARS-CoV-2 target nucleic acids are NOT DETECTED.  The SARS-CoV-2 RNA is generally detectable in upper respiratory specimens during the acute phase of infection. The lowest concentration of SARS-CoV-2 viral copies  this assay can detect is 138 copies/mL. A negative result does not preclude SARS-Cov-2 infection and should not be used as the sole basis for treatment or other patient management decisions. A negative result may occur with  improper specimen collection/handling, submission of specimen other than nasopharyngeal swab, presence of viral mutation(s) within the areas targeted by this assay, and inadequate number of viral copies(<138 copies/mL). A negative result must be combined with clinical observations, patient history, and epidemiological information. The expected result is Negative.  Fact Sheet for Patients:  EntrepreneurPulse.com.au  Fact Sheet for Healthcare Providers:  IncredibleEmployment.be  This test is no t yet approved or cleared by the [place] FDA and  has been authorized for detection and/or diagnosis of SARS-CoV-2 by FDA under an Emergency Use Authorization (EUA). This EUA will remain  in effect (meaning this test can be used) for the duration of the COVID-19 declaration under Section 564(b)(1) of the Act, 21 U.S.C.section 360bbb-3(b)(1), unless the authorization is terminated  or revoked sooner.       Influenza A by PCR NEGATIVE NEGATIVE Final   Influenza B by PCR NEGATIVE NEGATIVE Final    Comment: (NOTE) The Xpert Xpress SARS-CoV-2/FLU/RSV plus assay is intended as an aid in the diagnosis of influenza from Nasopharyngeal swab specimens and should not be used as a sole basis for treatment. Nasal washings and aspirates are unacceptable for Xpert Xpress SARS-CoV-2/FLU/RSV testing.  Fact Sheet for Patients: EntrepreneurPulse.com.au  Fact Sheet for Healthcare Providers: IncredibleEmployment.be  This test is not yet approved or cleared by the [place] FDA and has been authorized for detection and/or diagnosis of SARS-CoV-2 by FDA under an Emergency Use Authorization (EUA). This EUA  will remain in effect (meaning this test can be used) for the duration of the COVID-19 declaration under Section 564(b)(1) of the Act, 21 U.S.C. section 360bbb-3(b)(1), unless the authorization is terminated or revoked.  Performed at [facility], [address], [place], [place] [address]       Radiology Studies: DG Chest 1 View  Result Date: 06/02/2020 CLINICAL DATA:  Shortness of breath. EXAM: CHEST  1 VIEW COMPARISON:  Radiograph and CT yesterday.  PET CT 3 days ago FINDINGS: Lower lung volumes from prior exam. Chronic volume loss in the right hemithorax. Pleuroparenchymal opacity at the right lung apex is well as diffuse irregular opacities throughout the right lung, progressive at the right lung base. Left  basilar pulmonary pulmonary nodules on CT are not well-defined by radiograph. Chronic interstitial coarsening throughout the left hemithorax. No pneumothorax or significant pleural effusion.2 bony destructive changes involving the right first and second rib were better seen on prior CT. Surgical hardware in the left proximal humerus. IMPRESSION: 1. Progressive irregular right lung opacities particularly at the bases, favor atelectasis, infection could have a similar appearance. 2. Unchanged volume loss in the right hemithorax with pleuroparenchymal opacity at the apex corresponding to known malignancy. Underlying chronic interstitial lung changes. Many of the known pulmonary nodules are not well seen by radiograph. 3. Known osseous destructive changes about the right first and second ribs. Electronically Signed   By: Keith Rake M.D.   On: 06/02/2020 03:57   DG Chest 2 View  Result Date: 06/01/2020 CLINICAL DATA:  Shortness of breath. Known exposure to COVID-19 this week. EXAM: CHEST - 2 VIEW COMPARISON:  CT imaging from May 22, 2020 FINDINGS: The known right apical mass is appreciated. The known right middle lobe mass is best appreciated on the lateral view. Known  interstitial lung disease is identified. Given the significant chronic changes in the lungs and the lack of a comparison chest x-ray, it would be difficult to exclude mild acute on chronic ground-glass infiltrates. IMPRESSION: Given the significant chronic changes in the lungs and the lack of a comparison chest x-ray, it would be difficult to exclude mild acute on chronic ground-glass infiltrates. Known malignancy on the right as above. Known interstitial lung disease. Electronically Signed   By: Dorise Bullion III M.D   On: 06/01/2020 13:01   CT Angio Chest PE W and/or Wo Contrast  Result Date: 06/01/2020 CLINICAL DATA:  87 year old female with shortness of breath. Right apical mass. EXAM: CT ANGIOGRAPHY CHEST WITH CONTRAST TECHNIQUE: Multidetector CT imaging of the chest was performed using the standard protocol during bolus administration of intravenous contrast. Multiplanar CT image reconstructions and MIPs were obtained to evaluate the vascular anatomy. CONTRAST:  57m OMNIPAQUE IOHEXOL 350 MG/ML SOLN COMPARISON:  Chest CT dated 05/02/2020. FINDINGS: Cardiovascular: There is no cardiomegaly or pericardial effusion. Coronary vascular calcification. There is advanced atherosclerotic calcification the thoracic aorta. No aneurysmal dilatation or dissection. Evaluation of the pulmonary arteries is limited due to respiratory motion artifact. No large or central pulmonary artery embolus identified. Mediastinum/Nodes: Mildly enlarged right hilar lymph node measures 12 mm. There is a small hiatal hernia. No mediastinal fluid collection. Lungs/Pleura: Right apical consolidation/mass as seen previously. Multiple bilateral pulmonary nodules consistent with metastatic disease. Interval increase in the size of the nodular with prosthesis since the prior CT. There is an area of consolidation involving the right middle lobe progressed since the prior CT. There is increased interstitial prominence and diffuse ground-glass  density throughout the upper lobes and right middle lobe compared to prior CT which may represent progression of lymphangitic carcinomatosis or pneumonia. Clinical correlation is recommended. There is no pleural effusion pneumothorax. The central airways are patent. Upper Abdomen: Faint hepatic hypodense lesions most consistent with metastatic disease. Musculoskeletal: Erosive changes of the right first and second ribs secondary to right apical mass. Review of the MIP images confirms the above findings. IMPRESSION: 1. No CT evidence of central pulmonary artery embolus. 2. Right apical consolidation/mass as seen previously. Interval increase in the size of the metastatic disease compared to prior CT. 3. Interval increase in the interstitial prominence and diffuse ground-glass density throughout the upper lobes and right middle lobe compared to the prior CT which may represent progression  of lymphangitic carcinomatosis or pneumonia. 4. Mildly enlarged right hilar lymph node. 5. Hepatic metastatic disease. 6. Aortic Atherosclerosis (ICD10-I70.0). Electronically Signed   By: Anner Crete M.D.   On: 06/01/2020 21:56    Scheduled Meds: . aspirin EC  81 mg Oral BID  . fluticasone  1 spray Each Nare BID   And  . azelastine  1 spray Each Nare BID  . enoxaparin (LOVENOX) injection  40 mg Subcutaneous Q24H  . ferrous sulfate  325 mg Oral BID WC  . loratadine  10 mg Oral Daily  . losartan  50 mg Oral Daily  . metoprolol tartrate  25 mg Oral BID  . multivitamin with minerals  1 tablet Oral Daily  . pantoprazole  40 mg Oral Daily  . vitamin B-12  1,000 mcg Oral BID   Continuous Infusions: . azithromycin    . cefTRIAXone (ROCEPHIN)  IV       LOS: 1 day   Time spent: 40 minutes  Aggie Douse Loann Quill, MD [facility]  If 7PM-7AM, please contact night-coverage [email or web address] 06/02/2020, 10:14 AM

## 2020-06-03 ENCOUNTER — Ambulatory Visit: Payer: Medicare PPO

## 2020-06-03 DIAGNOSIS — I248 Other forms of acute ischemic heart disease: Secondary | ICD-10-CM | POA: Diagnosis not present

## 2020-06-03 DIAGNOSIS — R0602 Shortness of breath: Secondary | ICD-10-CM

## 2020-06-03 DIAGNOSIS — R0902 Hypoxemia: Secondary | ICD-10-CM

## 2020-06-03 DIAGNOSIS — J189 Pneumonia, unspecified organism: Secondary | ICD-10-CM | POA: Diagnosis not present

## 2020-06-03 LAB — CBC
HCT: 42.8 % (ref 36.0–46.0)
Hemoglobin: 13.6 g/dL (ref 12.0–15.0)
MCH: 28.3 pg (ref 26.0–34.0)
MCHC: 31.8 g/dL (ref 30.0–36.0)
MCV: 89 fL (ref 80.0–100.0)
Platelets: 191 10*3/uL (ref 150–400)
RBC: 4.81 MIL/uL (ref 3.87–5.11)
RDW: 17 % — ABNORMAL HIGH (ref 11.5–15.5)
WBC: 26.9 10*3/uL — ABNORMAL HIGH (ref 4.0–10.5)
nRBC: 0.1 % (ref 0.0–0.2)

## 2020-06-03 LAB — LIPID PANEL
Cholesterol: 128 mg/dL (ref 0–200)
HDL: 31 mg/dL — ABNORMAL LOW (ref >40)
LDL Cholesterol: 67 mg/dL (ref 0–99)
Total CHOL/HDL Ratio: 4.1 RATIO
Triglycerides: 148 mg/dL (ref <150)
VLDL: 30 mg/dL (ref 0–40)

## 2020-06-03 LAB — PATHOLOGIST SMEAR REVIEW

## 2020-06-03 LAB — MAGNESIUM: Magnesium: 1.8 mg/dL (ref 1.7–2.4)

## 2020-06-03 NOTE — Progress Notes (Signed)
Late entry: initially placed patient on her home cpap with o2 bleed in 8liters. RN reported desaturation after I left room.  Placed patient on hospital v60 in CPAP mode with 35% o2. Patient tolerated interventions well.

## 2020-06-03 NOTE — Progress Notes (Signed)
Called by RN for pt low SPO2. Found pt on 6L [place], SPO2 87%.Placed pt on 10 HFNC, SPO2 94%.

## 2020-06-03 NOTE — ED Notes (Signed)
Pt requesting to be placed back on nasal cannula, states she will not be going back to sleep. Pt placed on 5L nasal cannula, O2 saturation 93%.

## 2020-06-03 NOTE — Progress Notes (Addendum)
PROGRESS NOTE    [name]  AGT:[id number] DOB: [birth year] DOA: 06/01/2020 PCP: Baxter Hire, MD   Brief Narrative:  Kim Newman is an 87 y.o. female.  Patient is a pleasant elderly female with remote history of tobacco smoking, history of hypertension, obstructive sleep apnea, history of lung nodules (incidental finding 4 years ago) with recent diagnosis of lung cancer a week prior to Christmas who was recommended radiation treatment.  As per patient, she has been in her usual state of health but over the past week, has had worsening shortness of breath with associated dry cough.  Symptoms have been associated with easy fatigability and generalized weakness has decided to come to the emergency room today for further evaluation and management.She is fully vaccinated number listed for the Covid vaccine.    ED course: On presentation, patient was noted to be hypoxic requiring oxygen supplementation.  WBC was noted to be markedly elevated at 30K.  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   Assessment & Plan:   Acute hypoxemic respiratory failure in the setting of community-acquired pneumonia and underlying suspected lung cancer: -Patient presented with shortness of breath and requiring BiPAP -Reviewed chest x-ray and CTA chest. -She is afebrile with markedly elevated white count of 36,000--improved to 26.9. -Continue Rocephin and azithromycin -Urine strep antigen: Negative and Legionella antigen: Pending -Currently requiring 5 L of oxygen via nasal cannula.  We will try to wean off of oxygen as tolerated. -Continue to monitor vitals closely.  Sepsis in the setting of pneumonia: -Patient met sepsis criteria on admission with tachycardia, tachypnea, leukocytosis, lactic acid: 1.8, COVID-19 negative. -Continue IV antibiotics as above.  UA is negative for infection  History of  right upper lobe mass: -Sees Dr. Donella Stade oncology outpatient.  Unclear underlying etiology patient is yet to have a biopsy.  She has a scheduled appointment with Dr. Donella Stade on 1/12 for possible biopsy and discuss about the treatment options. -She have not decided yet regarding biopsy and further treatment- for lung cancer.  Elevated troponin: -Demand Ischemia secondary to pneumonia and lung cancer -Initial troponin: 59 trended down to 49.  Patient denies active chest pain.  Reviewed EKG.  Likely in the setting of demand ischemia due to acute hypoxemic respiratory failure.  Trend troponin.   -Echo shows ejection fraction of 55 to 60% with grade 1 diastolic dysfunction, no focal wall motion abnormality.  -Appreciate cardiology's recommendation-recommend to hold off on further ischemic work-up at this time and outpatient follow-up. -Continue aspirin, beta-blocker, ARB and statin.  Arrhythmias: -Reviewed EKG. no previous history of A. fib.  Repeat EKG shows normal sinus rhythm. -TSH is WNL.  Reviewed echo as above. -No further work-up as per cardiology  Hypertension: Blood pressure is stable. -Continue losartan and metoprolol  Iron deficiency anemia anemia: H&H is stable.  Continue ferrous sulfate  GERD: Continue PPI  Obstructive sleep apnea: On CPAP at home.  History of pulmonary fibrosis: Followed by pulmonology outpatient.  DVT prophylaxis: Lovenox Code Status: DNR-confirmed with the daughter Family Communication: None present at bedside.  Plan of care discussed with patient in length and he verbalized understanding and agreed with it.  Disposition Plan: TBD   Consultants:   cardiology  Procedures:   CTA  Antimicrobials:   Azithromycin  Rocephin   Status is: Inpatient  Dispo: The patient is from: Home  Anticipated d/c is to: Home              Anticipated d/c date is: 3 days              Patient currently is not medically stable to  d/c.    Subjective: Patient seen and examined.  On  nasal cannula, alert and oriented and communicating well.  Tells me that her breathing has improved and denies chest pain, leg swelling, orthopnea, PND, fever or chills.  -She is not sure whether she would like to pursue biopsy or further treatment for lung cancer.  Tells me that she will discuss with her daughter and will let us know as soon as possible.  Objective: Vitals:   06/03/20 0715 06/03/20 0729 06/03/20 0821 06/03/20 1127  BP:   (!) 154/55 133/69  Pulse: (!) 106  (!) 106 89  Resp: (!) $RemoveB'25  17 18  'GzMYbFYL$ Temp:   98.2 F (36.8 C) 98.1 F (36.7 C)  TempSrc:   Oral   SpO2: 97%  93% 93%  Weight:  66.7 kg    Height:  $Remove'5\' 2"'gnJmmjc$  (1.575 m)     No intake or output data in the 24 hours ending 06/03/20 1203 Filed Weights   06/01/20 1216 06/03/20 0729  Weight: 66.7 kg 66.7 kg    Examination:  General exam: Appears calm and comfortable on 5 L of oxygen via nasal cannula Respiratory system: Bilateral diffuse crackles positive Cardiovascular system: S1 & S2 heard, RRR. No JVD, murmurs, rubs, gallops or clicks. No pedal edema. Gastrointestinal system: Abdomen is nondistended, soft and nontender. No organomegaly or masses felt. Normal bowel sounds heard. Central nervous system: Alert and oriented. No focal neurological deficits. Extremities: Symmetric 5 x 5 power. Skin: No rashes, lesions or ulcers Psychiatry: Judgement and insight appear normal. Mood & affect appropriate.    Data Reviewed: I have personally reviewed following labs and imaging studies  CBC: Recent Labs  Lab 06/01/20 1223 06/02/20 0320 06/03/20 0417  WBC 30.1* 36.3* 26.9*  NEUTROABS  --  20.9*  --   HGB 14.9 15.2* 13.6  HCT 45.7 47.9* 42.8  MCV 86.9 89.0 89.0  PLT 194 213 381   Basic Metabolic Panel: Recent Labs  Lab 06/01/20 1223 06/02/20 0320 06/03/20 0417  NA 133* 133*  --   K 3.9 4.2  --   CL 97* 97*  --   CO2 24 26  --   GLUCOSE 142* 144*  --   BUN  19 15  --   CREATININE 0.75 0.63  --   CALCIUM 9.1 8.8*  --   MG  --   --  1.8   GFR: Estimated Creatinine Clearance: 41.8 mL/min (by C-G formula based on SCr of 0.63 mg/dL). Liver Function Tests: No results for input(s): AST, ALT, ALKPHOS, BILITOT, PROT, ALBUMIN in the last 168 hours. No results for input(s): LIPASE, AMYLASE in the last 168 hours. No results for input(s): AMMONIA in the last 168 hours. Coagulation Profile: Recent Labs  Lab 06/01/20 1223  INR 1.1   Cardiac Enzymes: No results for input(s): CKTOTAL, CKMB, CKMBINDEX, TROPONINI in the last 168 hours. BNP (last 3 results) No results for input(s): PROBNP in the last 8760 hours. HbA1C: Recent Labs    06/02/20 1256  HGBA1C 5.7*   CBG: Recent Labs  Lab 05/30/20 1413  GLUCAP 117*   Lipid Profile: Recent Labs    06/03/20 0417  CHOL 128  HDL 31*  LDLCALC 67  TRIG 148  CHOLHDL 4.1   Thyroid Function Tests: No results for input(s): TSH, T4TOTAL, FREET4, T3FREE, THYROIDAB in the last 72 hours. Anemia Panel: No results for input(s): VITAMINB12, FOLATE, FERRITIN, TIBC, IRON, RETICCTPCT in the last 72 hours. Sepsis Labs: Recent Labs  Lab 06/01/20 2009 06/02/20 0320 06/02/20 1125  PROCALCITON 0.15  --  <0.10  LATICACIDVEN 1.2 1.8  --     Recent Results (from the past 240 hour(s))  Blood culture (routine x 2)     Status: None (Preliminary result)   Collection Time: 06/01/20  7:02 PM   Specimen: BLOOD  Result Value Ref Range Status   Specimen Description BLOOD LEFT ANTECUBITAL  Final   Special Requests   Final    BOTTLES DRAWN AEROBIC AND ANAEROBIC Blood Culture adequate volume   Culture   Final    NO GROWTH 2 DAYS Performed at [facility], [address], [place], [place] [address]    Report Status PENDING  Incomplete  Blood culture (routine x 2)     Status: None (Preliminary result)   Collection Time: 06/01/20  8:09 PM   Specimen: BLOOD  Result Value Ref Range Status   Specimen  Description BLOOD RIGHT ANTECUBITAL  Final   Special Requests   Final    BOTTLES DRAWN AEROBIC AND ANAEROBIC Blood Culture results may not be optimal due to an excessive volume of blood received in culture bottles   Culture   Final    NO GROWTH 2 DAYS Performed at [facility], [address] [place], [place] [address]    Report Status PENDING  Incomplete  Resp Panel by RT-PCR (Flu A&B, Covid) Nasopharyngeal Swab     Status: None   Collection Time: 06/01/20  8:55 PM   Specimen: Nasopharyngeal Swab; Nasopharyngeal(NP) swabs in vial transport medium  Result Value Ref Range Status   SARS Coronavirus 2 by RT PCR NEGATIVE NEGATIVE Final    Comment: (NOTE) SARS-CoV-2 target nucleic acids are NOT DETECTED.  The SARS-CoV-2 RNA is generally detectable in upper respiratory specimens during the acute phase of infection. The lowest concentration of SARS-CoV-2 viral copies this assay can detect is 138 copies/mL. A negative result does not preclude SARS-Cov-2 infection and should not be used as the sole basis for treatment or other patient management decisions. A negative result may occur with  improper specimen collection/handling, submission of specimen other than nasopharyngeal swab, presence of viral mutation(s) within the areas targeted by this assay, and inadequate number of viral copies(<138 copies/mL). A negative result must be combined with clinical observations, patient history, and epidemiological information. The expected result is Negative.  Fact Sheet for Patients:  EntrepreneurPulse.com.au  Fact Sheet for Healthcare Providers:  IncredibleEmployment.be  This test is no t yet approved or cleared by the [place] FDA and  has been authorized for detection and/or diagnosis of SARS-CoV-2 by FDA under an Emergency Use Authorization (EUA). This EUA will remain  in effect (meaning this test can be used) for the duration of the COVID-19  declaration under Section 564(b)(1) of the Act, 21 U.S.C.section 360bbb-3(b)(1), unless the authorization is terminated  or revoked sooner.       Influenza A by PCR NEGATIVE NEGATIVE Final   Influenza B by PCR NEGATIVE NEGATIVE Final    Comment: (NOTE) The Xpert Xpress SARS-CoV-2/FLU/RSV plus assay is intended as an aid in the diagnosis of influenza from Nasopharyngeal swab specimens and should not be used as a sole basis for treatment. Nasal washings and aspirates are unacceptable  for Xpert Xpress SARS-CoV-2/FLU/RSV testing.  Fact Sheet for Patients: EntrepreneurPulse.com.au  Fact Sheet for Healthcare Providers: IncredibleEmployment.be  This test is not yet approved or cleared by the [place] FDA and has been authorized for detection and/or diagnosis of SARS-CoV-2 by FDA under an Emergency Use Authorization (EUA). This EUA will remain in effect (meaning this test can be used) for the duration of the COVID-19 declaration under Section 564(b)(1) of the Act, 21 U.S.C. section 360bbb-3(b)(1), unless the authorization is terminated or revoked.  Performed at [facility], [address] [place], [place] [address]       Radiology Studies: DG Chest 1 View  Result Date: 06/02/2020 CLINICAL DATA:  Shortness of breath. EXAM: CHEST  1 VIEW COMPARISON:  Radiograph and CT yesterday.  PET CT 3 days ago FINDINGS: Lower lung volumes from prior exam. Chronic volume loss in the right hemithorax. Pleuroparenchymal opacity at the right lung apex is well as diffuse irregular opacities throughout the right lung, progressive at the right lung base. Left basilar pulmonary pulmonary nodules on CT are not well-defined by radiograph. Chronic interstitial coarsening throughout the left hemithorax. No pneumothorax or significant pleural effusion.2 bony destructive changes involving the right first and second rib were better seen on prior CT. Surgical  hardware in the left proximal humerus. IMPRESSION: 1. Progressive irregular right lung opacities particularly at the bases, favor atelectasis, infection could have a similar appearance. 2. Unchanged volume loss in the right hemithorax with pleuroparenchymal opacity at the apex corresponding to known malignancy. Underlying chronic interstitial lung changes. Many of the known pulmonary nodules are not well seen by radiograph. 3. Known osseous destructive changes about the right first and second ribs. Electronically Signed   By: Keith Rake M.D.   On: 06/02/2020 03:57   DG Chest 2 View  Result Date: 06/01/2020 CLINICAL DATA:  Shortness of breath. Known exposure to COVID-19 this week. EXAM: CHEST - 2 VIEW COMPARISON:  CT imaging from May 22, 2020 FINDINGS: The known right apical mass is appreciated. The known right middle lobe mass is best appreciated on the lateral view. Known interstitial lung disease is identified. Given the significant chronic changes in the lungs and the lack of a comparison chest x-ray, it would be difficult to exclude mild acute on chronic ground-glass infiltrates. IMPRESSION: Given the significant chronic changes in the lungs and the lack of a comparison chest x-ray, it would be difficult to exclude mild acute on chronic ground-glass infiltrates. Known malignancy on the right as above. Known interstitial lung disease. Electronically Signed   By: Dorise Bullion III M.D   On: 06/01/2020 13:01   CT Angio Chest PE W and/or Wo Contrast  Result Date: 06/01/2020 CLINICAL DATA:  87 year old female with shortness of breath. Right apical mass. EXAM: CT ANGIOGRAPHY CHEST WITH CONTRAST TECHNIQUE: Multidetector CT imaging of the chest was performed using the standard protocol during bolus administration of intravenous contrast. Multiplanar CT image reconstructions and MIPs were obtained to evaluate the vascular anatomy. CONTRAST:  64mL OMNIPAQUE IOHEXOL 350 MG/ML SOLN COMPARISON:  Chest CT  dated 05/02/2020. FINDINGS: Cardiovascular: There is no cardiomegaly or pericardial effusion. Coronary vascular calcification. There is advanced atherosclerotic calcification the thoracic aorta. No aneurysmal dilatation or dissection. Evaluation of the pulmonary arteries is limited due to respiratory motion artifact. No large or central pulmonary artery embolus identified. Mediastinum/Nodes: Mildly enlarged right hilar lymph node measures 12 mm. There is a small hiatal hernia. No mediastinal fluid collection. Lungs/Pleura: Right apical consolidation/mass as seen previously.  Multiple bilateral pulmonary nodules consistent with metastatic disease. Interval increase in the size of the nodular with prosthesis since the prior CT. There is an area of consolidation involving the right middle lobe progressed since the prior CT. There is increased interstitial prominence and diffuse ground-glass density throughout the upper lobes and right middle lobe compared to prior CT which may represent progression of lymphangitic carcinomatosis or pneumonia. Clinical correlation is recommended. There is no pleural effusion pneumothorax. The central airways are patent. Upper Abdomen: Faint hepatic hypodense lesions most consistent with metastatic disease. Musculoskeletal: Erosive changes of the right first and second ribs secondary to right apical mass. Review of the MIP images confirms the above findings. IMPRESSION: 1. No CT evidence of central pulmonary artery embolus. 2. Right apical consolidation/mass as seen previously. Interval increase in the size of the metastatic disease compared to prior CT. 3. Interval increase in the interstitial prominence and diffuse ground-glass density throughout the upper lobes and right middle lobe compared to the prior CT which may represent progression of lymphangitic carcinomatosis or pneumonia. 4. Mildly enlarged right hilar lymph node. 5. Hepatic metastatic disease. 6. Aortic Atherosclerosis  (ICD10-I70.0). Electronically Signed   By: Anner Crete M.D.   On: 06/01/2020 21:56   ECHOCARDIOGRAM COMPLETE  Result Date: 06/02/2020    ECHOCARDIOGRAM REPORT   Patient Name:   Kim Newman Date of Exam: 06/02/2020 Medical Rec #:  [id number]       Height:       62.0 in Accession #:    [id number]      Weight:       147.0 lb Date of Birth:  1928-04-07        BSA:          1.677 m Patient Age:    34 years        BP:           134/63 mmHg Patient Gender: F               HR:           97 bpm. Exam Location:  [facility] Procedure: 2D Echo, Cardiac Doppler and Color Doppler Indications:     Abnormal ECG R94.31  History:         Patient has no prior history of Echocardiogram examinations.                  Risk Factors:Hypertension.  Sonographer:     Alyse Low Roar Referring Phys:  [id number] [place] Diagnosing Phys: Ida Rogue MD IMPRESSIONS  1. Left ventricular ejection fraction, by estimation, is 55 to 60%. The left ventricle has normal function. The left ventricle has no regional wall motion abnormalities. Left ventricular diastolic parameters are consistent with Grade I diastolic dysfunction (impaired relaxation).  2. Right ventricular systolic function is normal. The right ventricular size is normal. There is normal pulmonary artery systolic pressure. The estimated right ventricular systolic pressure is 27.7 mmHg.  3. The mitral valve is normal in structure. Mild to moderate mitral valve regurgitation. No evidence of mitral stenosis.  4. The aortic valve was not well visualized. FINDINGS  Left Ventricle: Left ventricular ejection fraction, by estimation, is 55 to 60%. The left ventricle has normal function. The left ventricle has no regional wall motion abnormalities. The left ventricular internal cavity size was normal in size. There is  no left ventricular hypertrophy. Left ventricular diastolic parameters are consistent with Grade I diastolic dysfunction (impaired relaxation). Right Ventricle: The right  ventricular  size is normal. No increase in right ventricular wall thickness. Right ventricular systolic function is normal. There is normal pulmonary artery systolic pressure. The tricuspid regurgitant velocity is 2.57 m/s, and  with an assumed right atrial pressure of 5 mmHg, the estimated right ventricular systolic pressure is 89.3 mmHg. Left Atrium: Left atrial size was normal in size. Right Atrium: Right atrial size was normal in size. Pericardium: There is no evidence of pericardial effusion. Mitral Valve: The mitral valve is normal in structure. Mild mitral annular calcification. Mild to moderate mitral valve regurgitation. No evidence of mitral valve stenosis. Tricuspid Valve: The tricuspid valve is normal in structure. Tricuspid valve regurgitation is mild . No evidence of tricuspid stenosis. Aortic Valve: The aortic valve was not well visualized. Aortic valve regurgitation is not visualized. No aortic stenosis is present. Aortic valve peak gradient measures 5.3 mmHg. Pulmonic Valve: The pulmonic valve was normal in structure. Pulmonic valve regurgitation is not visualized. No evidence of pulmonic stenosis. Aorta: The aortic root is normal in size and structure. Venous: The inferior vena cava is normal in size with greater than 50% respiratory variability, suggesting right atrial pressure of 3 mmHg. IAS/Shunts: No atrial level shunt detected by color flow Doppler.  LEFT VENTRICLE PLAX 2D LVIDd:         4.30 cm  Diastology LVIDs:         3.00 cm  LV e' medial:    23.70 cm/s LV PW:         0.90 cm  LV E/e' medial:  4.4 LV IVS:        0.90 cm  LV e' lateral:   10.40 cm/s LVOT diam:     1.80 cm  LV E/e' lateral: 10.0 LVOT Area:     2.54 cm  RIGHT VENTRICLE RV Mid diam:    3.40 cm RV S prime:     17.20 cm/s LEFT ATRIUM           Index       RIGHT ATRIUM           Index LA diam:      3.90 cm 2.33 cm/m  RA Area:     12.70 cm LA Vol (A4C): 41.5 ml 24.74 ml/m RA Volume:   29.50 ml  17.59 ml/m  AORTIC VALVE                 PULMONIC VALVE AV Area (Vmax): 1.74 cm    PV Vmax:        0.92 m/s AV Vmax:        115.00 cm/s PV Peak grad:   3.4 mmHg AV Peak Grad:   5.3 mmHg    RVOT Peak grad: 2 mmHg LVOT Vmax:      78.60 cm/s  AORTA Ao Root diam: 2.60 cm MITRAL VALVE                TRICUSPID VALVE MV Area (PHT): 4.10 cm     TR Peak grad:   26.4 mmHg MV Decel Time: 185 msec     TR Vmax:        257.00 cm/s MV E velocity: 104.00 cm/s MV A velocity: 129.00 cm/s  SHUNTS MV E/A ratio:  0.81         Systemic Diam: 1.80 cm MV A Prime:    14.9 cm/s Ida Rogue MD Electronically signed by Ida Rogue MD Signature Date/Time: 06/02/2020/5:41:58 PM    Final     Scheduled Meds: . aspirin  EC  81 mg Oral BID  . fluticasone  1 spray Each Nare BID   And  . azelastine  1 spray Each Nare BID  . enoxaparin (LOVENOX) injection  40 mg Subcutaneous Q24H  . ferrous sulfate  325 mg Oral BID WC  . ipratropium-albuterol  3 mL Nebulization Q6H  . loratadine  10 mg Oral Daily  . losartan  50 mg Oral Daily  . metoprolol tartrate  25 mg Oral BID  . multivitamin with minerals  1 tablet Oral Daily  . pantoprazole  40 mg Oral Daily  . vitamin B-12  1,000 mcg Oral BID   Continuous Infusions: . azithromycin Stopped (06/02/20 2306)  . cefTRIAXone (ROCEPHIN)  IV Stopped (06/02/20 2201)     LOS: 2 days   Time spent: 40 minutes  Arnetha Silverthorne Loann Quill, MD [facility]  If 7PM-7AM, please contact night-coverage [email or web address] 06/03/2020, 12:03 PM

## 2020-06-03 NOTE — Progress Notes (Signed)
Progress Note  Patient Name: Kim Newman Date of Encounter: 06/03/2020  Primary Cardiologist: New to [facility] - consult by [place]  Subjective   No chest pain.  Dyspnea about the same.  Nonproductive cough.  Minimal troponin elevation at 59 with a downtrending delta troponin.  Echo with preserved LVSF and normal wall motion.  Inpatient Medications    Scheduled Meds: . aspirin EC  81 mg Oral BID  . fluticasone  1 spray Each Nare BID   And  . azelastine  1 spray Each Nare BID  . enoxaparin (LOVENOX) injection  40 mg Subcutaneous Q24H  . ferrous sulfate  325 mg Oral BID WC  . ipratropium-albuterol  3 mL Nebulization Q6H  . loratadine  10 mg Oral Daily  . losartan  50 mg Oral Daily  . metoprolol tartrate  25 mg Oral BID  . multivitamin with minerals  1 tablet Oral Daily  . pantoprazole  40 mg Oral Daily  . vitamin B-12  1,000 mcg Oral BID   Continuous Infusions: . azithromycin Stopped (06/02/20 2306)  . cefTRIAXone (ROCEPHIN)  IV Stopped (06/02/20 2201)   PRN Meds: albuterol   Vital Signs    Vitals:   06/03/20 0700 06/03/20 0715 06/03/20 0729 06/03/20 0821  BP: 130/75   (!) 154/55  Pulse: (!) 102 (!) 106  (!) 106  Resp: 17 (!) 25  17  Temp:    98.2 F (36.8 C)  TempSrc:    Oral  SpO2: 95% 97%  93%  Weight:   66.7 kg   Height:   5\' 2"  (1.575 m)    No intake or output data in the 24 hours ending 06/03/20 0939 Filed Weights   06/01/20 1216 06/03/20 0729  Weight: 66.7 kg 66.7 kg    Telemetry    SR - Personally Reviewed  ECG    No new tracing - Personally Reviewed  Physical Exam   GEN: No acute distress.   Neck: No JVD. Cardiac: RRR, no murmurs, rubs, or gallops.  Respiratory:  Diminished and coarse breath sounds bilaterally.  GI: Soft, nontender, non-distended.   MS: No edema; No deformity. Neuro:  Alert and oriented x 3; Nonfocal.  Psych: Normal affect.  Labs    Chemistry Recent Labs  Lab 06/01/20 1223 06/02/20 0320  NA 133* 133*  K 3.9  4.2  CL 97* 97*  CO2 24 26  GLUCOSE 142* 144*  BUN 19 15  CREATININE 0.75 0.63  CALCIUM 9.1 8.8*  GFRNONAA >60 >60  ANIONGAP 12 10     Hematology Recent Labs  Lab 06/01/20 1223 06/02/20 0320 06/03/20 0417  WBC 30.1* 36.3* 26.9*  RBC 5.26* 5.38* 4.81  HGB 14.9 15.2* 13.6  HCT 45.7 47.9* 42.8  MCV 86.9 89.0 89.0  MCH 28.3 28.3 28.3  MCHC 32.6 31.7 31.8  RDW 16.7* 17.1* 17.0*  PLT 194 213 191    Cardiac EnzymesNo results for input(s): TROPONINI in the last 168 hours. No results for input(s): TROPIPOC in the last 168 hours.   BNPNo results for input(s): BNP, PROBNP in the last 168 hours.   DDimer  Recent Labs  Lab 06/01/20 1223  DDIMER 3.41*     Radiology    DG Chest 1 View  Result Date: 06/02/2020 IMPRESSION: 1. Progressive irregular right lung opacities particularly at the bases, favor atelectasis, infection could have a similar appearance. 2. Unchanged volume loss in the right hemithorax with pleuroparenchymal opacity at the apex corresponding to known malignancy. Underlying  chronic interstitial lung changes. Many of the known pulmonary nodules are not well seen by radiograph. 3. Known osseous destructive changes about the right first and second ribs. Electronically Signed   By: Keith Rake M.D.   On: 06/02/2020 03:57   DG Chest 2 View  Result Date: 06/01/2020 IMPRESSION: Given the significant chronic changes in the lungs and the lack of a comparison chest x-ray, it would be difficult to exclude mild acute on chronic ground-glass infiltrates. Known malignancy on the right as above. Known interstitial lung disease. Electronically Signed   By: Dorise Bullion III M.D   On: 06/01/2020 13:01   CT Angio Chest PE W and/or Wo Contrast  Result Date: 06/01/2020 IMPRESSION: 1. No CT evidence of central pulmonary artery embolus. 2. Right apical consolidation/mass as seen previously. Interval increase in the size of the metastatic disease compared to prior CT. 3. Interval  increase in the interstitial prominence and diffuse ground-glass density throughout the upper lobes and right middle lobe compared to the prior CT which may represent progression of lymphangitic carcinomatosis or pneumonia. 4. Mildly enlarged right hilar lymph node. 5. Hepatic metastatic disease. 6. Aortic Atherosclerosis (ICD10-I70.0). Electronically Signed   By: Anner Crete M.D.   On: 06/01/2020 21:56    Cardiac Studies   2D echo 06/02/2020: 1. Left ventricular ejection fraction, by estimation, is 55 to 60%. The  left ventricle has normal function. The left ventricle has no regional  wall motion abnormalities. Left ventricular diastolic parameters are  consistent with Grade I diastolic  dysfunction (impaired relaxation).  2. Right ventricular systolic function is normal. The right ventricular  size is normal. There is normal pulmonary artery systolic pressure. The  estimated right ventricular systolic pressure is 39.7 mmHg.  3. The mitral valve is normal in structure. Mild to moderate mitral valve  regurgitation. No evidence of mitral stenosis.  4. The aortic valve was not well visualized.  Patient Profile     87 y.o. female with history of pulmonary nodule with recent diagnosis of lung cancer, remote tobacco use, HTN, OSA, and GERD admitted with acute hypoxic respiratory failure in the setting of pneumonia and pulmonary mass who we are seeing for elevated troponin and question of arrhythmia.  Assessment & Plan    1.  Elevated troponin: -Initial high-sensitivity troponin minimally elevated at 59 with a downtrending delta troponin of 49 -Likely supply demand ischemia in the setting of acute hypoxic respiratory failure secondary to underlying suspected lung cancer and pneumonia -Not consistent with ACS -Never with chest -EKG nonacute -Echo with normal LV systolic function and normal wall motion -No plans for inpatient ischemic evaluation in the setting of her acute  illness -ASA -Follow-up as outpatient  2. Question A. fib: -No evidence of A. fib on EKG or telemetry -No indication for anticoagulation -Monitor on telemetry during admission  3. Acute hypoxic respiratory failure: -Suspected to be in the setting of right upper lobe pulmonary mass and pneumonia -No evidence of decompensated heart failure -Would not diurese -Management per internal medicine  4. HTN: -Blood pressure well controlled -Continue current therapy  For questions or updates, please contact [place] Please consult [email or web address] for contact info under Cardiology/STEMI.    Signed, Christell Faith, PA-C [place] Pager: [phone] 06/03/2020, 9:39 AM

## 2020-06-03 NOTE — ED Notes (Signed)
Pt's O2 saturation noted to be 86-88% on CPaP. Pt placed back on 5L nasal cannula and O2 increased to 94%, RT made aware.

## 2020-06-03 NOTE — ED Notes (Addendum)
This RN entered room to introduce self to patient, pt noted to be next to bed on floor on knees. Pt assisted back into bed by Melody ED Tech, this RN and April RN. Pt alert and oriented, denies pain or injury. States she was attempting to get out of bed and "slid out of bed onto the floor." Pt states she fell approximately 15 minutes prior to my arrival to room. Pt placed on bed alarm and non-slip socks placed on patient. Pt apologetic, states she understands not to get out of bed without assistance.

## 2020-06-03 NOTE — ED Notes (Signed)
[place] NP paged to make aware of patient's fall.

## 2020-06-04 ENCOUNTER — Inpatient Hospital Stay: Payer: Medicare PPO

## 2020-06-04 ENCOUNTER — Ambulatory Visit: Payer: Medicare PPO

## 2020-06-04 ENCOUNTER — Inpatient Hospital Stay: Payer: Medicare PPO | Admitting: Oncology

## 2020-06-04 DIAGNOSIS — J189 Pneumonia, unspecified organism: Secondary | ICD-10-CM | POA: Diagnosis not present

## 2020-06-04 DIAGNOSIS — C3491 Malignant neoplasm of unspecified part of right bronchus or lung: Secondary | ICD-10-CM

## 2020-06-04 LAB — CBC
HCT: 48.2 % — ABNORMAL HIGH (ref 36.0–46.0)
Hemoglobin: 14.9 g/dL (ref 12.0–15.0)
MCH: 28 pg (ref 26.0–34.0)
MCHC: 30.9 g/dL (ref 30.0–36.0)
MCV: 90.6 fL (ref 80.0–100.0)
Platelets: 215 10*3/uL (ref 150–400)
RBC: 5.32 MIL/uL — ABNORMAL HIGH (ref 3.87–5.11)
RDW: 17 % — ABNORMAL HIGH (ref 11.5–15.5)
WBC: 28.7 10*3/uL — ABNORMAL HIGH (ref 4.0–10.5)
nRBC: 0.1 % (ref 0.0–0.2)

## 2020-06-04 LAB — URINE CULTURE: Culture: <10000 — AB

## 2020-06-04 LAB — BASIC METABOLIC PANEL
Anion gap: 13 (ref 5–15)
BUN: 16 mg/dL (ref 8–23)
CO2: 30 mmol/L (ref 22–32)
Calcium: 9.1 mg/dL (ref 8.9–10.3)
Chloride: 95 mmol/L — ABNORMAL LOW (ref 98–111)
Creatinine, Ser: 0.56 mg/dL (ref 0.44–1.00)
GFR, Estimated: >60 mL/min (ref >60)
Glucose, Bld: 132 mg/dL — ABNORMAL HIGH (ref 70–99)
Potassium: 3.9 mmol/L (ref 3.5–5.1)
Sodium: 138 mmol/L (ref 135–145)

## 2020-06-04 LAB — LEGIONELLA PNEUMOPHILA SEROGP 1 UR AG: L. pneumophila Serogp 1 Ur Ag: NEGATIVE

## 2020-06-04 MED ORDER — FUROSEMIDE 10 MG/ML IJ SOLN
40.0000 mg | Freq: Once | INTRAMUSCULAR | Status: AC
  Administered 2020-06-04: 40 mg via INTRAVENOUS
  Filled 2020-06-04: qty 4

## 2020-06-04 MED ORDER — HALOPERIDOL LACTATE 5 MG/ML IJ SOLN: 1.0000 mg | Freq: Four times a day (QID) | INTRAMUSCULAR | Status: DC | PRN

## 2020-06-04 MED ORDER — IPRATROPIUM-ALBUTEROL 0.5-2.5 (3) MG/3ML IN SOLN
3.0000 mL | Freq: Two times a day (BID) | RESPIRATORY_TRACT | Status: DC
  Administered 2020-06-05 – 2020-06-06 (×3): 3 mL via RESPIRATORY_TRACT
  Filled 2020-06-04 (×3): qty 3

## 2020-06-04 NOTE — Plan of Care (Signed)
Pt resting in bed after neb tx by RT.Pt calm w/o any distress.Pt alert and verbal,responds appropriately to commands,denies any pain or discomfort.Daughter at bedside visiting. Pt progressing with care plans.Pt cont on Rocephin and Zithromax Abx respectively for PNA.no adverse rxns noted.Appetite and fluid intake fair.will cont to monitor.

## 2020-06-04 NOTE — Progress Notes (Signed)
PROGRESS NOTE    Kim Newman  FFM:[id number] DOB: 12/16/1927 DOA: 06/01/2020 PCP: Kim Hire, MD   Brief Narrative:  Kim Newman is an 87 y.o. female.  Patient is a pleasant elderly female with remote history of tobacco smoking, history of hypertension, obstructive sleep apnea, history of lung nodules (incidental finding 4 years ago) with recent diagnosis of lung cancer a week prior to Christmas who was recommended radiation treatment.  As per patient, she has been in her usual state of health but over the past week, has had worsening shortness of breath with associated dry cough.  Symptoms have been associated with easy fatigability and generalized weakness has decided to come to the emergency room today for further evaluation and management.She is fully vaccinated number listed for the Covid vaccine.    ED course: On presentation, patient was noted to be hypoxic requiring oxygen supplementation.  WBC was noted to be markedly elevated at 30K.  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   Assessment & Plan:   Acute hypoxemic respiratory failure in the setting of community-acquired pneumonia with underlying suspected lung cancer: -Patient presented with shortness of breath and requiring BiPAP & then HFNC -Reviewed chest x-ray and CTA chest. -She is afebrile with markedly elevated white count of 36,000--> 26.9--> 28.7. -Continue Rocephin and azithromycin -Urine strep antigen: Negative and Legionella antigen: Pending -Currently requiring 5 L of oxygen via nasal cannula.  We will try to wean off of oxygen as tolerated. -Continue to monitor vitals closely.  Sepsis in the setting of pneumonia: -Patient met sepsis criteria on admission with tachycardia, tachypnea, leukocytosis, lactic acid: 1.8, COVID-19 negative. -Continue IV antibiotics as above.  UA is negative for  infection  History of right upper lobe mass: -Sees Dr. Donella Stade oncology outpatient.  Unclear underlying etiology patient is yet to have a biopsy.  She has a scheduled appointment with Dr. Donella Stade on 1/12 for possible biopsy and discuss about the treatment options. -She have not decided yet for biopsy and further treatment- for lung cancer.  Elevated troponin: -Demand Ischemia secondary to pneumonia and lung cancer -Initial troponin: 59 trended down to 49.  Patient denies active chest pain.  Reviewed EKG.  Likely in the setting of demand ischemia due to acute hypoxemic respiratory failure.  Trend troponin.   -Echo shows ejection fraction of 55 to 60% with grade 1 diastolic dysfunction, no focal wall motion abnormality.  -Appreciate cardiology's recommendation-recommend to hold off on further ischemic work-up at this time and outpatient follow-up. -Continue aspirin, beta-blocker, ARB and statin.  Arrhythmias: -Reviewed EKG. no previous history of A. fib.  Repeat EKG shows normal sinus rhythm. -TSH is WNL.  Reviewed echo as above. -No further work-up as per cardiology  Hypertension: Blood pressure is stable. -Continue losartan and metoprolol  Iron deficiency anemia anemia: H&H is stable.  Continue ferrous sulfate  GERD: Continue PPI  Obstructive sleep apnea: On CPAP at home.  History of pulmonary fibrosis: Followed by pulmonology outpatient.  Overall poor prognosis in the setting of lung mass and other chronic medical problems.  She is DNR-however have not decided whether she would like to go forward for biopsy and further treatment for lung cancer.  We will consult palliative care to discuss goals of care with the patient.  DVT prophylaxis: Lovenox Code Status: DNR-confirmed with the daughter Family Communication: None present at bedside.  Plan of care discussed with patient in  length and he verbalized understanding and agreed with it.  I called patient's daughter Kim Newman and discussed  plan of care and she verbalized understanding.   Disposition Plan: TBD   Consultants:   cardiology  Procedures:   CTA  Antimicrobials:   Azithromycin  Rocephin   Status is: Inpatient  Dispo: The patient is from: Home              Anticipated d/c is to: Home              Anticipated d/c date is: 3 days              Patient currently is not medically stable to d/c.    Subjective: Patient seen and examined.  Resting comfortably on the bed, on nasal cannula, tells me that her breathing she thinks is getting better.  Denies fever, chills, chest pain, headache, blurry vision, nausea or vomiting.  No acute events overnight.  Objective: Vitals:   06/03/20 2300 06/04/20 0058 06/04/20 0524 06/04/20 0812  BP:  (!) 153/82 (!) 154/72 (!) 158/77  Pulse:  (!) 110 (!) 110 (!) 105  Resp:  _0 Temp:  99.2 F (37.3 C) 97.7 F (36.5 C) 98.3 F (36.8 C)  TempSrc:  Oral Oral   SpO2: 94% 96% 97% 95%  Weight:      Height:        Intake/Output Summary (Last 24 hours) at 06/04/2020 0955 Last data filed at 06/04/2020 0300 Gross per 24 hour  Intake 450 ml  Output 1 ml  Net 449 ml   Filed Weights   06/01/20 1216 06/03/20 0729  Weight: 66.7 kg 66.7 kg    Examination:  General exam: Appears calm and comfortable on  nasal cannula Respiratory system: Coarse breath sounds noted R >L.  No wheezing or rhonchi. Cardiovascular system: S1 & S2 heard, RRR. No JVD, murmurs, rubs, gallops or clicks. No pedal edema. Gastrointestinal system: Abdomen is nondistended, soft and nontender. No organomegaly or masses felt. Normal bowel sounds heard. Central nervous system: Alert and oriented. No focal neurological deficits. Extremities: Symmetric 5 x 5 power. Skin: No rashes, lesions or ulcers Psychiatry: Judgement and insight appear normal. Mood & affect appropriate.    Data Reviewed: I have personally reviewed following labs and imaging studies  CBC: Recent Labs  Lab 06/01/20 1223  06/02/20 0320 06/03/20 0417 06/04/20 0622  WBC 30.1* 36.3* 26.9* 28.7*  NEUTROABS  --  20.9*  --   --   HGB 14.9 15.2* 13.6 14.9  HCT 45.7 47.9* 42.8 48.2*  MCV 86.9 89.0 89.0 90.6  PLT 194 213 191 732   Basic Metabolic Panel: Recent Labs  Lab 06/01/20 1223 06/02/20 0320 06/03/20 0417 06/04/20 0622  NA 133* 133*  --  138  K 3.9 4.2  --  3.9  CL 97* 97*  --  95*  CO2 24 26  --  30  GLUCOSE 142* 144*  --  132*  BUN 19 15  --  16  CREATININE 0.75 0.63  --  0.56  CALCIUM 9.1 8.8*  --  9.1  MG  --   --  1.8  --    GFR: Estimated Creatinine Clearance: 41.8 mL/min (by C-G formula based on SCr of 0.56 mg/dL). Liver Function Tests: No results for input(s): AST, ALT, ALKPHOS, BILITOT, PROT, ALBUMIN in the last 168 hours. No results for input(s): LIPASE, AMYLASE in the last 168 hours. No results for input(s): AMMONIA in the last 168  hours. Coagulation Profile: Recent Labs  Lab 06/01/20 1223  INR 1.1   Cardiac Enzymes: No results for input(s): CKTOTAL, CKMB, CKMBINDEX, TROPONINI in the last 168 hours. BNP (last 3 results) No results for input(s): PROBNP in the last 8760 hours. HbA1C: Recent Labs    06/02/20 1256  HGBA1C 5.7*   CBG: Recent Labs  Lab 05/30/20 1413  GLUCAP 117*   Lipid Profile: Recent Labs    06/03/20 0417  CHOL 128  HDL 31*  LDLCALC 67  TRIG 148  CHOLHDL 4.1   Thyroid Function Tests: No results for input(s): TSH, T4TOTAL, FREET4, T3FREE, THYROIDAB in the last 72 hours. Anemia Panel: No results for input(s): VITAMINB12, FOLATE, FERRITIN, TIBC, IRON, RETICCTPCT in the last 72 hours. Sepsis Labs: Recent Labs  Lab 06/01/20 2009 06/02/20 0320 06/02/20 1125  PROCALCITON 0.15  --  <0.10  LATICACIDVEN 1.2 1.8  --     Recent Results (from the past 240 hour(s))  Blood culture (routine x 2)     Status: None (Preliminary result)   Collection Time: 06/01/20  7:02 PM   Specimen: BLOOD  Result Value Ref Range Status   Specimen Description BLOOD  LEFT ANTECUBITAL  Final   Special Requests   Final    BOTTLES DRAWN AEROBIC AND ANAEROBIC Blood Culture adequate volume   Culture   Final    NO GROWTH 3 DAYS Performed at [facility], [address], [place], [place] [address]    Report Status PENDING  Incomplete  Blood culture (routine x 2)     Status: None (Preliminary result)   Collection Time: 06/01/20  8:09 PM   Specimen: BLOOD  Result Value Ref Range Status   Specimen Description BLOOD RIGHT ANTECUBITAL  Final   Special Requests   Final    BOTTLES DRAWN AEROBIC AND ANAEROBIC Blood Culture results may not be optimal due to an excessive volume of blood received in culture bottles   Culture   Final    NO GROWTH 3 DAYS Performed at [facility], [address], [place], [place] [address]    Report Status PENDING  Incomplete  Resp Panel by RT-PCR (Flu A&B, Covid) Nasopharyngeal Swab     Status: None   Collection Time: 06/01/20  8:55 PM   Specimen: Nasopharyngeal Swab; Nasopharyngeal(NP) swabs in vial transport medium  Result Value Ref Range Status   SARS Coronavirus 2 by RT PCR NEGATIVE NEGATIVE Final    Comment: (NOTE) SARS-CoV-2 target nucleic acids are NOT DETECTED.  The SARS-CoV-2 RNA is generally detectable in upper respiratory specimens during the acute phase of infection. The lowest concentration of SARS-CoV-2 viral copies this assay can detect is 138 copies/mL. A negative result does not preclude SARS-Cov-2 infection and should not be used as the sole basis for treatment or other patient management decisions. A negative result may occur with  improper specimen collection/handling, submission of specimen other than nasopharyngeal swab, presence of viral mutation(s) within the areas targeted by this assay, and inadequate number of viral copies(<138 copies/mL). A negative result must be combined with clinical observations, patient history, and epidemiological information. The expected result is  Negative.  Fact Sheet for Patients:  EntrepreneurPulse.com.au  Fact Sheet for Healthcare Providers:  IncredibleEmployment.be  This test is no t yet approved or cleared by the [place] FDA and  has been authorized for detection and/or diagnosis of SARS-CoV-2 by FDA under an Emergency Use Authorization (EUA). This EUA will remain  in effect (meaning this test can be  used) for the duration of the COVID-19 declaration under Section 564(b)(1) of the Act, 21 U.S.C.section 360bbb-3(b)(1), unless the authorization is terminated  or revoked sooner.       Influenza A by PCR NEGATIVE NEGATIVE Final   Influenza B by PCR NEGATIVE NEGATIVE Final    Comment: (NOTE) The Xpert Xpress SARS-CoV-2/FLU/RSV plus assay is intended as an aid in the diagnosis of influenza from Nasopharyngeal swab specimens and should not be used as a sole basis for treatment. Nasal washings and aspirates are unacceptable for Xpert Xpress SARS-CoV-2/FLU/RSV testing.  Fact Sheet for Patients: EntrepreneurPulse.com.au  Fact Sheet for Healthcare Providers: IncredibleEmployment.be  This test is not yet approved or cleared by the [place] FDA and has been authorized for detection and/or diagnosis of SARS-CoV-2 by FDA under an Emergency Use Authorization (EUA). This EUA will remain in effect (meaning this test can be used) for the duration of the COVID-19 declaration under Section 564(b)(1) of the Act, 21 U.S.C. section 360bbb-3(b)(1), unless the authorization is terminated or revoked.  Performed at [facility], [address] [place], [place] [address]       Radiology Studies: ECHOCARDIOGRAM COMPLETE  Result Date: 06/02/2020    ECHOCARDIOGRAM REPORT   Patient Name:   SOSHA SHEPHERD Date of Exam: 06/02/2020 Medical Rec #:  [id number]       Height:       62.0 in Accession #:    [id number]      Weight:       147.0 lb Date of  Birth:  August 11, 1927        BSA:          1.677 m Patient Age:    55 years        BP:           134/63 mmHg Patient Gender: F               HR:           97 bpm. Exam Location:  [facility] Procedure: 2D Echo, Cardiac Doppler and Color Doppler Indications:     Abnormal ECG R94.31  History:         Patient has no prior history of Echocardiogram examinations.                  Risk Factors:Hypertension.  Sonographer:     Alyse Low Roar Referring Phys:  [id number] [place] Diagnosing Phys: Ida Rogue MD IMPRESSIONS  1. Left ventricular ejection fraction, by estimation, is 55 to 60%. The left ventricle has normal function. The left ventricle has no regional wall motion abnormalities. Left ventricular diastolic parameters are consistent with Grade I diastolic dysfunction (impaired relaxation).  2. Right ventricular systolic function is normal. The right ventricular size is normal. There is normal pulmonary artery systolic pressure. The estimated right ventricular systolic pressure is 35.4 mmHg.  3. The mitral valve is normal in structure. Mild to moderate mitral valve regurgitation. No evidence of mitral stenosis.  4. The aortic valve was not well visualized. FINDINGS  Left Ventricle: Left ventricular ejection fraction, by estimation, is 55 to 60%. The left ventricle has normal function. The left ventricle has no regional wall motion abnormalities. The left ventricular internal cavity size was normal in size. There is  no left ventricular hypertrophy. Left ventricular diastolic parameters are consistent with Grade I diastolic dysfunction (impaired relaxation). Right Ventricle: The right ventricular size is normal. No increase in right ventricular wall thickness. Right ventricular systolic function is normal.  There is normal pulmonary artery systolic pressure. The tricuspid regurgitant velocity is 2.57 m/s, and  with an assumed right atrial pressure of 5 mmHg, the estimated right ventricular systolic pressure is 25.4 mmHg.  Left Atrium: Left atrial size was normal in size. Right Atrium: Right atrial size was normal in size. Pericardium: There is no evidence of pericardial effusion. Mitral Valve: The mitral valve is normal in structure. Mild mitral annular calcification. Mild to moderate mitral valve regurgitation. No evidence of mitral valve stenosis. Tricuspid Valve: The tricuspid valve is normal in structure. Tricuspid valve regurgitation is mild . No evidence of tricuspid stenosis. Aortic Valve: The aortic valve was not well visualized. Aortic valve regurgitation is not visualized. No aortic stenosis is present. Aortic valve peak gradient measures 5.3 mmHg. Pulmonic Valve: The pulmonic valve was normal in structure. Pulmonic valve regurgitation is not visualized. No evidence of pulmonic stenosis. Aorta: The aortic root is normal in size and structure. Venous: The inferior vena cava is normal in size with greater than 50% respiratory variability, suggesting right atrial pressure of 3 mmHg. IAS/Shunts: No atrial level shunt detected by color flow Doppler.  LEFT VENTRICLE PLAX 2D LVIDd:         4.30 cm  Diastology LVIDs:         3.00 cm  LV e' medial:    23.70 cm/s LV PW:         0.90 cm  LV E/e' medial:  4.4 LV IVS:        0.90 cm  LV e' lateral:   10.40 cm/s LVOT diam:     1.80 cm  LV E/e' lateral: 10.0 LVOT Area:     2.54 cm  RIGHT VENTRICLE RV Mid diam:    3.40 cm RV S prime:     17.20 cm/s LEFT ATRIUM           Index       RIGHT ATRIUM           Index LA diam:      3.90 cm 2.33 cm/m  RA Area:     12.70 cm LA Vol (A4C): 41.5 ml 24.74 ml/m RA Volume:   29.50 ml  17.59 ml/m  AORTIC VALVE                PULMONIC VALVE AV Area (Vmax): 1.74 cm    PV Vmax:        0.92 m/s AV Vmax:        115.00 cm/s PV Peak grad:   3.4 mmHg AV Peak Grad:   5.3 mmHg    RVOT Peak grad: 2 mmHg LVOT Vmax:      78.60 cm/s  AORTA Ao Root diam: 2.60 cm MITRAL VALVE                TRICUSPID VALVE MV Area (PHT): 4.10 cm     TR Peak grad:   26.4 mmHg MV  Decel Time: 185 msec     TR Vmax:        257.00 cm/s MV E velocity: 104.00 cm/s MV A velocity: 129.00 cm/s  SHUNTS MV E/A ratio:  0.81         Systemic Diam: 1.80 cm MV A Prime:    14.9 cm/s Ida Rogue MD Electronically signed by Ida Rogue MD Signature Date/Time: 06/02/2020/5:41:58 PM    Final     Scheduled Meds: . aspirin EC  81 mg Oral BID  . fluticasone  1 spray Each Nare BID  And  . azelastine  1 spray Each Nare BID  . enoxaparin (LOVENOX) injection  40 mg Subcutaneous Q24H  . ferrous sulfate  325 mg Oral BID WC  . [START ON 06/05/2020] ipratropium-albuterol  3 mL Nebulization BID  . loratadine  10 mg Oral Daily  . losartan  50 mg Oral Daily  . metoprolol tartrate  25 mg Oral BID  . multivitamin with minerals  1 tablet Oral Daily  . pantoprazole  40 mg Oral Daily  . vitamin B-12  1,000 mcg Oral BID   Continuous Infusions: . azithromycin 500 mg (06/03/20 2241)  . cefTRIAXone (ROCEPHIN)  IV 2 g (06/03/20 2123)     LOS: 3 days   Time spent: 40 minutes   Loann Quill, MD [facility]  If 7PM-7AM, please contact night-coverage [email or web address] 06/04/2020, 9:55 AM

## 2020-06-05 ENCOUNTER — Ambulatory Visit: Payer: Medicare PPO

## 2020-06-05 DIAGNOSIS — Z7189 Other specified counseling: Secondary | ICD-10-CM | POA: Diagnosis not present

## 2020-06-05 DIAGNOSIS — L8990 Pressure ulcer of unspecified site, unspecified stage: Secondary | ICD-10-CM | POA: Diagnosis present

## 2020-06-05 DIAGNOSIS — J189 Pneumonia, unspecified organism: Secondary | ICD-10-CM | POA: Diagnosis not present

## 2020-06-05 DIAGNOSIS — I10 Essential (primary) hypertension: Secondary | ICD-10-CM | POA: Diagnosis present

## 2020-06-05 DIAGNOSIS — I5033 Acute on chronic diastolic (congestive) heart failure: Secondary | ICD-10-CM | POA: Diagnosis not present

## 2020-06-05 DIAGNOSIS — Z515 Encounter for palliative care: Secondary | ICD-10-CM | POA: Diagnosis not present

## 2020-06-05 DIAGNOSIS — J9601 Acute respiratory failure with hypoxia: Secondary | ICD-10-CM | POA: Diagnosis present

## 2020-06-05 DIAGNOSIS — C3490 Malignant neoplasm of unspecified part of unspecified bronchus or lung: Secondary | ICD-10-CM

## 2020-06-05 LAB — BLOOD GAS, ARTERIAL
Acid-Base Excess: 13.4 mmol/L — ABNORMAL HIGH (ref 0.0–2.0)
Bicarbonate: 40.5 mmol/L — ABNORMAL HIGH (ref 20.0–28.0)
FIO2: 12
O2 Saturation: 93.5 %
Patient temperature: 37
pCO2 arterial: 61 mmHg — ABNORMAL HIGH (ref 32.0–48.0)
pH, Arterial: 7.43 (ref 7.350–7.450)
pO2, Arterial: 67 mmHg — ABNORMAL LOW (ref 83.0–108.0)

## 2020-06-05 LAB — COMPREHENSIVE METABOLIC PANEL
ALT: 55 U/L — ABNORMAL HIGH (ref 0–44)
AST: 62 U/L — ABNORMAL HIGH (ref 15–41)
Albumin: 2.5 g/dL — ABNORMAL LOW (ref 3.5–5.0)
Alkaline Phosphatase: 101 U/L (ref 38–126)
Anion gap: 11 (ref 5–15)
BUN: 14 mg/dL (ref 8–23)
CO2: 35 mmol/L — ABNORMAL HIGH (ref 22–32)
Calcium: 8.7 mg/dL — ABNORMAL LOW (ref 8.9–10.3)
Chloride: 93 mmol/L — ABNORMAL LOW (ref 98–111)
Creatinine, Ser: 0.49 mg/dL (ref 0.44–1.00)
GFR, Estimated: >60 mL/min (ref >60)
Glucose, Bld: 150 mg/dL — ABNORMAL HIGH (ref 70–99)
Potassium: 3.8 mmol/L (ref 3.5–5.1)
Sodium: 139 mmol/L (ref 135–145)
Total Bilirubin: 0.4 mg/dL (ref 0.3–1.2)
Total Protein: 6.4 g/dL — ABNORMAL LOW (ref 6.5–8.1)

## 2020-06-05 LAB — CBC
HCT: 47.6 % — ABNORMAL HIGH (ref 36.0–46.0)
Hemoglobin: 15.1 g/dL — ABNORMAL HIGH (ref 12.0–15.0)
MCH: 28.5 pg (ref 26.0–34.0)
MCHC: 31.7 g/dL (ref 30.0–36.0)
MCV: 89.8 fL (ref 80.0–100.0)
Platelets: 201 10*3/uL (ref 150–400)
RBC: 5.3 MIL/uL — ABNORMAL HIGH (ref 3.87–5.11)
RDW: 16.5 % — ABNORMAL HIGH (ref 11.5–15.5)
WBC: 26.5 10*3/uL — ABNORMAL HIGH (ref 4.0–10.5)
nRBC: 0 % (ref 0.0–0.2)

## 2020-06-05 LAB — MAGNESIUM: Magnesium: 1.9 mg/dL (ref 1.7–2.4)

## 2020-06-05 LAB — BRAIN NATRIURETIC PEPTIDE: B Natriuretic Peptide: 1013.8 pg/mL — ABNORMAL HIGH (ref 0.0–100.0)

## 2020-06-05 MED ORDER — FUROSEMIDE 10 MG/ML IJ SOLN
40.0000 mg | Freq: Two times a day (BID) | INTRAMUSCULAR | Status: DC
  Administered 2020-06-05 – 2020-06-09 (×8): 40 mg via INTRAVENOUS
  Filled 2020-06-05 (×9): qty 4

## 2020-06-05 MED ORDER — MORPHINE SULFATE (PF) 2 MG/ML IV SOLN: 1.0000 mg | Freq: Once | INTRAVENOUS | Status: DC

## 2020-06-05 NOTE — Progress Notes (Signed)
   06/04/20 2334  Assess: MEWS Score  BP (!) 146/94  Pulse Rate (!) 114  SpO2 93 %  O2 Device HFNC  O2 Flow Rate (L/min) 12 L/min  Assess: MEWS Score  MEWS Temp 0  MEWS Systolic 0  MEWS Pulse 2  MEWS RR 0  MEWS LOC 0  MEWS Score 2  MEWS Score Color Yellow  Assess: if the MEWS score is Yellow or Red  Were vital signs taken at a resting state? Yes  Focused Assessment Change from prior assessment (see assessment flowsheet) (pt. c/o SOB)  Early Detection of Sepsis Score *See Row Information* Low  MEWS guidelines implemented *See Row Information* No, vital signs rechecked  Treat  MEWS Interventions Escalated (See documentation below)  Pain Scale 0-10  Pain Score 0  Take Vital Signs  Increase Vital Sign Frequency  Yellow: Q 2hr X 2 then Q 4hr X 2, if remains yellow, continue Q 4hrs  Escalate  MEWS: Escalate Yellow: discuss with charge nurse/RN and consider discussing with provider and RRT  Notify: Charge Nurse/RN  Name of Charge Nurse/RN Notified Tam  Date Charge Nurse/RN Notified 06/04/20  Time Charge Nurse/RN Notified 2340  Notify: Provider  Provider Name/Title Argie Ramming  Date Provider Notified 06/04/20  Time Provider Notified 2340  Notification Type  (secure chat)  Notification Reason Change in status  Response No new orders  Date of Provider Response 06/04/20  Time of Provider Response 2349  Notify: Rapid Response  Name of Rapid Response RN Notified beth  Date Rapid Response Notified 06/04/20  Time Rapid Response Notified 2353  Document  Patient Outcome Stabilized after interventions  Progress note created (see row info) Yes

## 2020-06-05 NOTE — Progress Notes (Signed)
   06/04/20 2105  Assess: MEWS Score  BP (!) 166/91  Pulse Rate (!) 143  Resp (!) 24  SpO2 (!) 85 %  O2 Device Nasal Cannula  O2 Flow Rate (L/min) 10 L/min  FiO2 (%) (!) 0 %  Assess: MEWS Score  MEWS Temp 0  MEWS Systolic 0  MEWS Pulse 3  MEWS RR 1  MEWS LOC 0  MEWS Score 4  MEWS Score Color Red  Assess: if the MEWS score is Yellow or Red  Were vital signs taken at a resting state? Yes  Focused Assessment Change from prior assessment (see assessment flowsheet)  Early Detection of Sepsis Score *See Row Information* Low  MEWS guidelines implemented *See Row Information* Yes  Treat  MEWS Interventions Administered scheduled meds/treatments;Administered prn meds/treatments  Pain Scale 0-10  Pain Score 0  Pain Type Acute pain  Take Vital Signs  Increase Vital Sign Frequency  Yellow: Q 2hr X 2 then Q 4hr X 2, if remains yellow, continue Q 4hrs  Escalate  MEWS: Escalate Yellow: discuss with charge nurse/RN and consider discussing with provider and RRT  Notify: Charge Nurse/RN  Name of Charge Nurse/RN Notified Tam  Date Charge Nurse/RN Notified 06/04/20  Time Charge Nurse/RN Notified 2109  Notify: Provider  Provider Name/Title J.Mansy  Date Provider Notified 06/04/20  Time Provider Notified 2145  Notification Type  (secure chat)  Notification Reason Change in status  Response No new orders  Date of Provider Response 06/04/20  Time of Provider Response 2059  Document  Patient Outcome Not stable and remains on department  Progress note created (see row info) Yes

## 2020-06-05 NOTE — Progress Notes (Signed)
PROGRESS NOTE  Kim Newman [id number] DOB: 03-Jun-1927 DOA: 06/01/2020 PCP: Baxter Hire, MD  HPI/Recap of past 75 hours: 87 year old female with past medical history of hypertension, obstructive sleep apnea and had a recent diagnosis of lung cancer in mid December who had been in her usual state of health, not on oxygen, started having progressively worsening shortness of breath over the past week and then came into the emergency room on 1/8 for further evaluation.  COVID-negative.  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  Procalcitonin minimal at 0.15 with a normal lactic acid level on admission.  Patient started on Rocephin and Zithromax and placed on 5 L of oxygen.  Also noted to have an elevated troponin mild.  She was seen by cardiology.  Felt to be demand ischemia.  On 1/11, oxygen needs have slowly increased up to 10 L high flow and on night of 1/11, patient noted to be in respiratory distress requiring 12 L nasal cannula.  Also found to have bibasilar crackles given 40 mg of IV Lasix plus 1 mg of morphine which did help her breathing.  This morning, patient states her breathing is a little bit better.  We had an extensive discussion and she understands that she may not make it through this hospitalization.  Patient was seen by palliative care and hope to speak to her oncologist for further decision-making.  Assessment/Plan: Principal Problem:   Acute respiratory failure with hypoxia and hypercapnia (HCC) secondary to extensive lung cancer plus acute on chronic diastolic heart failure: Less likely that this is any type of infectious process given normal procalcitonin level by following day.  White count is more elevated due to stress margination.  BMP checked on 1/12 and found to be markedly  elevated at 1013.  Have started IV Lasix which should help with her breathing.  Echocardiogram done on 1/9 noted diastolic dysfunction.  Started IV Lasix. Active Problems:   Pressure injury of skin:  Pressure Injury 06/03/20 Sacrum Medial Stage 2 -  Partial thickness loss of dermis presenting as a shallow open injury with a red, pink wound bed without slough. (Active)  06/03/20 0900  Location: Sacrum  Location Orientation: Medial  Staging: Stage 2 -  Partial thickness loss of dermis presenting as a shallow open injury with a red, pink wound bed without slough.  Wound Description (Comments):   Present on Admission: Yes      Hypertension: Blood pressure stable.  Code Status: DNR  Family Communication: Spoke with daughter by phone  Disposition Plan: Depending on what is decided after discussion with oncology   Consultants:  Palliative care  Procedures:  None  Antimicrobials:  IV Rocephin and Zithromax 1/8-present  DVT prophylaxis: Lovenox   Objective: Vitals:   06/05/20 0909 06/05/20 1208  BP: (!) 146/82 (!) 154/78  Pulse: (!) 101 (!) 109  Resp: 18 18  Temp: 97.9 F (36.6 C) 98.1 F (36.7 C)  SpO2: 93% 92%    Intake/Output Summary (Last 24 hours) at 06/05/2020 1641 Last data filed at 06/05/2020 1023 Gross per 24 hour  Intake 240 ml  Output 1400 ml  Net -1160 ml   Filed Weights   06/01/20 1216 06/03/20 0729  Weight: 66.7 kg 66.7 kg   Body mass index is 26.89 kg/m.  Exam:   General: Alert and oriented x2,  no acute distress  HEENT: Normocephalic atraumatic, mucous brains are moist  Cardiovascular: Regular rate and rhythm, S1-S2  Respiratory: Decreased breath sounds throughout, scattered Rales  Abdomen: Soft, nontender, nondistended, positive bowel sounds  Musculoskeletal: No clubbing or cyanosis, 1+ pitting edema bilaterally  Psychiatry: Appropriate, no evidence of psychosis   Data Reviewed: CBC: Recent Labs  Lab 06/01/20 1223  06/02/20 0320 06/03/20 0417 06/04/20 0622 06/05/20 0318  WBC 30.1* 36.3* 26.9* 28.7* 26.5*  NEUTROABS  --  20.9*  --   --   --   HGB 14.9 15.2* 13.6 14.9 15.1*  HCT 45.7 47.9* 42.8 48.2* 47.6*  MCV 86.9 89.0 89.0 90.6 89.8  PLT 194 213 191 215 563   Basic Metabolic Panel: Recent Labs  Lab 06/01/20 1223 06/02/20 0320 06/03/20 0417 06/04/20 0622 06/05/20 0318  NA 133* 133*  --  138 139  K 3.9 4.2  --  3.9 3.8  CL 97* 97*  --  95* 93*  CO2 24 26  --  30 35*  GLUCOSE 142* 144*  --  132* 150*  BUN 19 15  --  16 14  CREATININE 0.75 0.63  --  0.56 0.49  CALCIUM 9.1 8.8*  --  9.1 8.7*  MG  --   --  1.8  --  1.9   GFR: Estimated Creatinine Clearance: 41.8 mL/min (by C-G formula based on SCr of 0.49 mg/dL). Liver Function Tests: Recent Labs  Lab 06/05/20 0318  AST 62*  ALT 55*  ALKPHOS 101  BILITOT 0.4  PROT 6.4*  ALBUMIN 2.5*   No results for input(s): LIPASE, AMYLASE in the last 168 hours. No results for input(s): AMMONIA in the last 168 hours. Coagulation Profile: Recent Labs  Lab 06/01/20 1223  INR 1.1   Cardiac Enzymes: No results for input(s): CKTOTAL, CKMB, CKMBINDEX, TROPONINI in the last 168 hours. BNP (last 3 results) No results for input(s): PROBNP in the last 8760 hours. HbA1C: No results for input(s): HGBA1C in the last 72 hours. CBG: Recent Labs  Lab 05/30/20 1413  GLUCAP 117*   Lipid Profile: Recent Labs    06/03/20 0417  CHOL 128  HDL 31*  LDLCALC 67  TRIG 148  CHOLHDL 4.1   Thyroid Function Tests: No results for input(s): TSH, T4TOTAL, FREET4, T3FREE, THYROIDAB in the last 72 hours. Anemia Panel: No results for input(s): VITAMINB12, FOLATE, FERRITIN, TIBC, IRON, RETICCTPCT in the last 72 hours. Urine analysis:    Component Value Date/Time   COLORURINE YELLOW (A) 06/02/2020 1545   APPEARANCEUR CLEAR (A) 06/02/2020 1545   APPEARANCEUR Hazy 12/14/2013 1133   LABSPEC 1.044 (H) 06/02/2020 1545   LABSPEC 1.008 12/14/2013 1133    PHURINE 5.0 06/02/2020 1545   GLUCOSEU NEGATIVE 06/02/2020 1545   GLUCOSEU Negative 12/14/2013 1133   HGBUR NEGATIVE 06/02/2020 1545   BILIRUBINUR NEGATIVE 06/02/2020 1545   BILIRUBINUR Negative 12/14/2013 1133   KETONESUR 5 (A) 06/02/2020 1545   PROTEINUR NEGATIVE 06/02/2020 1545   NITRITE NEGATIVE 06/02/2020 1545   LEUKOCYTESUR NEGATIVE 06/02/2020 1545   LEUKOCYTESUR 3+ 12/14/2013 1133   Sepsis Labs: @LABRCNTIP (procalcitonin:4,lacticidven:4)  ) Recent Results (from the past 240 hour(s))  Blood culture (routine x 2)     Status: None (Preliminary result)   Collection Time: 06/01/20  7:02 PM   Specimen: BLOOD  Result Value Ref Range Status   Specimen Description BLOOD LEFT ANTECUBITAL  Final   Special Requests   Final    BOTTLES DRAWN AEROBIC AND ANAEROBIC Blood Culture adequate volume  Culture   Final    NO GROWTH 4 DAYS Performed at [facility], [address] [place], [place] [address]    Report Status PENDING  Incomplete  Blood culture (routine x 2)     Status: None (Preliminary result)   Collection Time: 06/01/20  8:09 PM   Specimen: BLOOD  Result Value Ref Range Status   Specimen Description BLOOD RIGHT ANTECUBITAL  Final   Special Requests   Final    BOTTLES DRAWN AEROBIC AND ANAEROBIC Blood Culture results may not be optimal due to an excessive volume of blood received in culture bottles   Culture   Final    NO GROWTH 4 DAYS Performed at [facility], [address] [place], [place] [address]    Report Status PENDING  Incomplete  Resp Panel by RT-PCR (Flu A&B, Covid) Nasopharyngeal Swab     Status: None   Collection Time: 06/01/20  8:55 PM   Specimen: Nasopharyngeal Swab; Nasopharyngeal(NP) swabs in vial transport medium  Result Value Ref Range Status   SARS Coronavirus 2 by RT PCR NEGATIVE NEGATIVE Final    Comment: (NOTE) SARS-CoV-2 target nucleic acids are NOT DETECTED.  The SARS-CoV-2 RNA is generally detectable in upper  respiratory specimens during the acute phase of infection. The lowest concentration of SARS-CoV-2 viral copies this assay can detect is 138 copies/mL. A negative result does not preclude SARS-Cov-2 infection and should not be used as the sole basis for treatment or other patient management decisions. A negative result may occur with  improper specimen collection/handling, submission of specimen other than nasopharyngeal swab, presence of viral mutation(s) within the areas targeted by this assay, and inadequate number of viral copies(<138 copies/mL). A negative result must be combined with clinical observations, patient history, and epidemiological information. The expected result is Negative.  Fact Sheet for Patients:  EntrepreneurPulse.com.au  Fact Sheet for Healthcare Providers:  IncredibleEmployment.be  This test is no t yet approved or cleared by the [place] FDA and  has been authorized for detection and/or diagnosis of SARS-CoV-2 by FDA under an Emergency Use Authorization (EUA). This EUA will remain  in effect (meaning this test can be used) for the duration of the COVID-19 declaration under Section 564(b)(1) of the Act, 21 U.S.C.section 360bbb-3(b)(1), unless the authorization is terminated  or revoked sooner.       Influenza A by PCR NEGATIVE NEGATIVE Final   Influenza B by PCR NEGATIVE NEGATIVE Final    Comment: (NOTE) The Xpert Xpress SARS-CoV-2/FLU/RSV plus assay is intended as an aid in the diagnosis of influenza from Nasopharyngeal swab specimens and should not be used as a sole basis for treatment. Nasal washings and aspirates are unacceptable for Xpert Xpress SARS-CoV-2/FLU/RSV testing.  Fact Sheet for Patients: EntrepreneurPulse.com.au  Fact Sheet for Healthcare Providers: IncredibleEmployment.be  This test is not yet approved or cleared by the [place] FDA and has been  authorized for detection and/or diagnosis of SARS-CoV-2 by FDA under an Emergency Use Authorization (EUA). This EUA will remain in effect (meaning this test can be used) for the duration of the COVID-19 declaration under Section 564(b)(1) of the Act, 21 U.S.C. section 360bbb-3(b)(1), unless the authorization is terminated or revoked.  Performed at [facility], [address] [place], [place] [address]   Urine Culture     Status: Abnormal   Collection Time: 06/02/20  3:45 PM   Specimen: Urine, Random  Result Value Ref Range Status   Specimen Description   Final  URINE, RANDOM Performed at [facility], [address] [place], [place] [address]    Special Requests   Final    NONE Performed at [facility], [address] [place], [place] [address]    Culture (A)  Final    <10,000 COLONIES/mL INSIGNIFICANT GROWTH Performed at [facility] [address], [place],  [address]    Report Status 06/04/2020 FINAL  Final      Studies: DG Chest Port 1 View  Result Date: 06/04/2020 CLINICAL DATA:  Shortness of breath EXAM: PORTABLE CHEST 1 VIEW COMPARISON:  06/02/2020 FINDINGS: Extensive bilateral airspace opacities, worsening slightly since prior study. Elevation of the right hemidiaphragm. Small right pleural effusion. Heart is normal size. Aortic atherosclerosis. IMPRESSION: Worsening diffuse bilateral airspace disease. Small right effusion. Electronically Signed   By: Rolm Baptise M.D.   On: 06/04/2020 23:57    Scheduled Meds: . aspirin EC  81 mg Oral BID  . fluticasone  1 spray Each Nare BID   And  . azelastine  1 spray Each Nare BID  . enoxaparin (LOVENOX) injection  40 mg Subcutaneous Q24H  . ferrous sulfate  325 mg Oral BID WC  . ipratropium-albuterol  3 mL Nebulization BID  . loratadine  10 mg Oral Daily  . losartan  50 mg Oral Daily  . metoprolol tartrate  25 mg Oral BID  .  morphine injection  1 mg Intravenous Once  .  multivitamin with minerals  1 tablet Oral Daily  . pantoprazole  40 mg Oral Daily  . vitamin B-12  1,000 mcg Oral BID    Continuous Infusions: . azithromycin 500 mg (06/04/20 2247)  . cefTRIAXone (ROCEPHIN)  IV 2 g (06/04/20 2132)     LOS: 4 days     Annita Brod, MD [facility]   06/05/2020, 4:41 PM

## 2020-06-05 NOTE — Progress Notes (Addendum)
The patient was seen on 06/04/2020 around 11: 50 pm and examined in rapid response. She had a MEWS 2 at 1618 today. She has been on high flow oxygen 10L since last night. Her HR went up to 143 BP 161/91 RR 24 oxygen is 87% on 12L. She was given a NEB treatment and metoprolol.  Recommendation was for RT to up the patient's oxygen for pulse ox and to more than 92%.  Pulse ox later was down to 78% and she was short of breath.  Stat portable chest x-ray showed bilateral worsening pulmonary infiltrates and suspected pulmonary congestion.  On physical exam she was having bibasal crackles significant diminished right basal and midlung zone breath sounds as well as left basal breath sounds.  She was ordered 40 mg of IV Lasix and 1 mg of IV morphine sulfate.  Pulse oximetry was up to 93% and she was more comfortable.  The patient was mouth breathing and was ordered Ventimask.  We will continue monitoring.  The patient is DNR.

## 2020-06-05 NOTE — Plan of Care (Signed)
  Problem: Education: Goal: Knowledge of General Education information will improve Description: Including pain rating scale, medication(s)/side effects and non-pharmacologic comfort measures Outcome: Progressing   Problem: Activity: Goal: Ability to tolerate increased activity will improve Outcome: Progressing   

## 2020-06-05 NOTE — Consult Note (Signed)
Consultation Note Date: 06/05/2020   Patient Name: Kim Newman  DOB: [birth year]  MRN: [id number]  Age / Sex: 87 y.o., female  PCP: Baxter Hire, MD Referring Physician: Annita Brod, MD  Reason for Consultation: Establishing goals of care  HPI/Patient Profile: 87 y.o. female  with past medical history of smoking, hypertension, obstructive sleep apnea, lung nodules (incidential finding 4 years ago), and recent finding lung mass/suspected lung cancer admitted on 06/01/2020 with shortness of breath, cough, and weakness. In ED patient hypoxic requiring oxygen supplementation. CT chest concerning for right upper lobe mass and increased ground glass opacities suggesting lymphangitic carcinomatosis versus pneumonia. Hospital admission for acute hypoxemic respiratory failure started on antibiotics for community-acquired pneumonia. On 1/12 early AM, rapid response called for tachycardia, hypoxia on 12L, and worsening respiratory status. The patient is requiring 12L HFNC this afternoon.   Of note, patient was evaluated by Dr. Donella Stade on 05/09/20 with concern for stage IV non-small cell lung cancer. PET scan ordered to complete staging and determine extent of disease and targets for palliative radiation. Plan was to see medical oncology as well. PET scan 05/30/20 reveals right apical mass with involvement of chest wall, corresponding with primary bronchogenic neoplasm. 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   Clinical Assessment and Goals of Care:  I have reviewed medical records, discussed with care team, and visited patient at bedside. Ms. Sunderlin is sleeping when I arrive. She appears mildly short of breath at rest and  remains on 12L HFNC. Daughter Tye Maryland) and granddaughter at bedside.   I introduced Palliative Medicine as specialized medical care for people living with serious illness. It focuses on providing relief from the symptoms and stress of a serious illness. Family shares they are familiar with palliative medicine from previous family members. They are appreciative of visit.   We discussed a brief life review of the patient. Prior to hospitalization, living with [facility] but independent and able to care for herself. No reported weight loss or decrease in appetite. Suspicion of cancer was found approximately 3 weeks ago. Daughter confirms they were evaluated by Dr. Donella Stade and plan was to see Dr. Grayland Ormond tomorrow, but unfortunately she was hospitalized.   Discussed events from last night. Daughter shares that when she walked in to her mother's room this afternoon, [place] told her daughter she isn't dying today. Discussed diagnoses and plan of care.   Patient and family have not received PET scan results. Frankly and compassionately shared PET scan results with daughter and granddaughter. Patient wakes by the end of my visit. Daughter prefer I not share PET results today with her mother.   Discussed oncology referral inpatient. Daughter would appreciate speaking to an oncologist. She understands her mother is sick and we discussed the fact that her respiratory failure is likely from metastatic lung cancer in addition to pneumonia.  Reassured of ongoing palliative support this admission.  Questions and concerns were addressed. [facility] Choices and PMT contact information given.   Discussed with RN and Dr. [place] Pink.     SUMMARY OF RECOMMENDATIONS    Continue current plan of care and medical management.  Recommend inpatient oncology referral. Patient was scheduled to see medical oncology this week. Daughter would appreciate an update from oncology and this may help guide patient decisions moving forward.   PMT  will continue to follow inpatient.   Code Status/Advance Care Planning:  DNR  Symptom Management:   Per attending  Palliative Prophylaxis:   Aspiration, Delirium Protocol, Frequent Pain Assessment, Oral Care and Turn Reposition  Psycho-social/Spiritual:   Desire for further Chaplaincy support: yes  Additional Recommendations: Caregiving  Support/Resources, Compassionate Wean Education and Education on Hospice  Prognosis:   Guarded with acute respiratory failure secondary to pneumonia and suspected metastatic lung cancer. PET scan with widespread metastatic disease.   Discharge Planning: To Be Determined      Primary Diagnoses: Present on Admission: . Pneumonia   I have reviewed the medical record, interviewed the patient and family, and examined the patient. The following aspects are pertinent.  Past Medical History:  Diagnosis Date  . Arthritis   . Cataract   . Cervical cancer ([place]) 1997  . GERD (gastroesophageal reflux disease)   . Hypertension   . Lung cancer ([place])   . Macular degeneration   . Obstructive sleep apnea    Social History   Socioeconomic History  . Marital status: Widowed    Spouse name: Not on file  . Number of children: Not on file  . Years of education: Not on file  . Highest education level: Not on file  Occupational History  . Not on file  Tobacco Use  . Smoking status: Former Smoker    Types: Cigarettes  . Smokeless tobacco: Never Used  Substance and Sexual Activity  . Alcohol use: Yes  . Drug use: No  . Sexual activity: Not on file  Other Topics Concern  . Not on file  Social History Narrative  . Not on file   Social Determinants of Health   Financial Resource Strain: Not on file  Food Insecurity: Not on file  Transportation Needs: Not on file  Physical Activity: Not on file  Stress: Not on file  Social Connections: Not on file   Family History  Problem Relation Age of Onset  . Breast cancer Neg Hx    Scheduled  Meds: . aspirin EC  81 mg Oral BID  . fluticasone  1 spray Each Nare BID   And  . azelastine  1 spray Each Nare BID  . enoxaparin (LOVENOX) injection  40 mg Subcutaneous Q24H  . ferrous sulfate  325 mg Oral BID WC  . ipratropium-albuterol  3 mL Nebulization BID  . loratadine  10 mg Oral Daily  . losartan  50 mg Oral Daily  . metoprolol tartrate  25 mg Oral BID  .  morphine injection  1 mg Intravenous Once  . multivitamin with minerals  1 tablet Oral Daily  . pantoprazole  40 mg Oral Daily  . vitamin B-12  1,000 mcg Oral BID   Continuous Infusions: . azithromycin 500 mg (06/04/20 2247)  . cefTRIAXone (ROCEPHIN)  IV 2 g (06/04/20 2132)   PRN Meds:.albuterol, haloperidol lactate Medications Prior to Admission:  Prior to Admission medications   Medication Sig Start Date End Date Taking? Authorizing Provider  ADVAIR DISKUS 100-50 MCG/DOSE AEPB Inhale 2 puffs into the lungs 2 (  two) times daily.    Yes [provider]  albuterol (VENTOLIN HFA) 108 (90 Base) MCG/ACT inhaler Inhale 2 puffs into the lungs 4 (four) times daily as needed for wheezing or shortness of breath.   Yes [provider]  aspirin EC 81 MG tablet Take 81 mg by mouth daily.   Yes [provider]  calcium carbonate (OS-CAL - DOSED IN MG OF ELEMENTAL CALCIUM) 1250 (500 Ca) MG tablet Take 1 tablet by mouth daily.   Yes [provider]  cetirizine (ZYRTEC) 10 MG tablet Take 10 mg by mouth daily.   Yes [provider]  cholecalciferol (VITAMIN D3) 25 MCG (1000 UNIT) tablet Take 1,000 Units by mouth daily.   Yes [provider]  DENTA 5000 PLUS 1.1 % CREA dental cream Take 1 application by mouth daily. 04/24/20  Yes [provider]  DYMISTA 137-50 MCG/ACT SUSP Place 1 spray into the nose 2 (two) times daily.  02/10/16  Yes [provider]  ferrous sulfate 325 (65 FE) MG tablet Take 1 tablet (325 mg total) by mouth 2 (two) times daily with a meal. 03/18/16  Yes  Sainani, Belia Heman, MD  Multiple Vitamins-Minerals (PRESERVISION AREDS 2 PO) Take 2 tablets by mouth 2 (two) times daily.   Yes [provider]  Omega-3 Fatty Acids (FISH OIL) 1000 MG CAPS Take 1,000 mg by mouth daily.   Yes [provider]  omeprazole (PRILOSEC) 20 MG capsule Take 20 capsules by mouth daily.   Yes [provider]  telmisartan (MICARDIS) 40 MG tablet Take 40 mg by mouth daily. 02/23/20  Yes [provider]  traMADol (ULTRAM) 50 MG tablet Take 50 mg by mouth every 12 (twelve) hours as needed for moderate pain.   Yes [provider]  vitamin B-12 (CYANOCOBALAMIN) 1000 MCG tablet Take 1,000 mcg by mouth daily.   Yes [provider]   Allergies  Allergen Reactions  . Statins   . Strawberry (Diagnostic)    Review of Systems  Respiratory: Positive for shortness of breath.    Physical Exam Vitals and nursing note reviewed.  Constitutional:      General: She is sleeping.     Appearance: She is ill-appearing.  Pulmonary:     Effort: No tachypnea, accessory muscle usage or respiratory distress.     Breath sounds: Decreased breath sounds present.     Comments: Dyspnea at rest. 12L HFNC Neurological:     Mental Status: She is easily aroused.     Comments: Oriented when awake    Vital Signs: BP (!) 154/78 (BP Location: Right Arm)   Pulse (!) 109   Temp 98.1 F (36.7 C)   Resp 18   Ht 5\' 2"  (1.575 m)   Wt 66.7 kg   SpO2 92%   BMI 26.89 kg/m  Pain Scale: 0-10   Pain Score: 0-No pain   SpO2: SpO2: 92 % O2 Device:SpO2: 92 % O2 Flow Rate: .O2 Flow Rate (L/min): 12 L/min  IO: Intake/output summary:   Intake/Output Summary (Last 24 hours) at 06/05/2020 1513 Last data filed at 06/05/2020 1023 Gross per 24 hour  Intake 240 ml  Output 1400 ml  Net -1160 ml    LBM: Last BM Date: 06/04/20 Baseline Weight: Weight: 66.7 kg Most recent weight: Weight: 66.7 kg     Palliative Assessment/Data: PPS 40%      Time  Total: 35 min Greater than 50%  of this time was spent counseling and coordinating care  related to the above assessment and plan.  Signed by:  Ihor Dow, DNP, FNP-C Palliative Medicine Team  Phone: [phone] Fax: [phone]   Please contact Palliative Medicine Team phone at [phone] for questions and concerns.  For individual provider: See Shea Evans

## 2020-06-05 NOTE — Progress Notes (Signed)
No changed from previous assessment. No new interventions needed, Patient on metoprolol for HR  06/05/20 0459  Assess: MEWS Score  Temp 98 F (36.7 C)  BP (!) 141/78  Pulse Rate (!) 111  Resp 18  Level of Consciousness Alert  SpO2 99 %  O2 Device CPAP  Assess: MEWS Score  MEWS Temp 0  MEWS Systolic 0  MEWS Pulse 2  MEWS RR 0  MEWS LOC 0  MEWS Score 2  MEWS Score Color Yellow  Assess: if the MEWS score is Yellow or Red  Were vital signs taken at a resting state? Yes  Focused Assessment No change from prior assessment  Early Detection of Sepsis Score *See Row Information* Low  MEWS guidelines implemented *See Row Information* No, vital signs rechecked  Treat  MEWS Interventions Other (Comment) (ni interventions needed)  Pain Scale 0-10  Pain Score 0  Escalate  MEWS: Escalate Yellow: discuss with charge nurse/RN and consider discussing with provider and RRT

## 2020-06-05 NOTE — Significant Event (Signed)
Rapid Response Event Note   Reason for Call : Dyspnea and decreased O2 sats   Initial Focused Assessment: Pt in bed on 12L bubble hiflow. Tachypneic and states short of breath. Bilateral breath sounds are coarse and rales.       Interventions:  Ordered stat chest xray. Messaged Dr. Sidney Ace who came to see pt  Plan of Care: Pt given MS and lasix. RN will monitor    Event Summary: Pt remains on A  MD Notified: Mansy Call Time:2345 Arrival Time:2348 End IEPP:[id number]  Silver Huguenin, RN

## 2020-06-06 ENCOUNTER — Ambulatory Visit: Payer: Medicare PPO

## 2020-06-06 DIAGNOSIS — C799 Secondary malignant neoplasm of unspecified site: Secondary | ICD-10-CM | POA: Diagnosis not present

## 2020-06-06 DIAGNOSIS — J9601 Acute respiratory failure with hypoxia: Secondary | ICD-10-CM | POA: Diagnosis not present

## 2020-06-06 DIAGNOSIS — Z7189 Other specified counseling: Secondary | ICD-10-CM

## 2020-06-06 DIAGNOSIS — I5033 Acute on chronic diastolic (congestive) heart failure: Secondary | ICD-10-CM | POA: Diagnosis not present

## 2020-06-06 DIAGNOSIS — C3490 Malignant neoplasm of unspecified part of unspecified bronchus or lung: Secondary | ICD-10-CM | POA: Diagnosis not present

## 2020-06-06 DIAGNOSIS — I10 Essential (primary) hypertension: Secondary | ICD-10-CM | POA: Diagnosis not present

## 2020-06-06 DIAGNOSIS — J9602 Acute respiratory failure with hypercapnia: Secondary | ICD-10-CM

## 2020-06-06 DIAGNOSIS — C787 Secondary malignant neoplasm of liver and intrahepatic bile duct: Secondary | ICD-10-CM | POA: Diagnosis present

## 2020-06-06 DIAGNOSIS — R918 Other nonspecific abnormal finding of lung field: Secondary | ICD-10-CM | POA: Diagnosis not present

## 2020-06-06 LAB — CBC
HCT: 48.3 % — ABNORMAL HIGH (ref 36.0–46.0)
Hemoglobin: 15.5 g/dL — ABNORMAL HIGH (ref 12.0–15.0)
MCH: 28.8 pg (ref 26.0–34.0)
MCHC: 32.1 g/dL (ref 30.0–36.0)
MCV: 89.6 fL (ref 80.0–100.0)
Platelets: 194 10*3/uL (ref 150–400)
RBC: 5.39 MIL/uL — ABNORMAL HIGH (ref 3.87–5.11)
RDW: 16.2 % — ABNORMAL HIGH (ref 11.5–15.5)
WBC: 26.1 10*3/uL — ABNORMAL HIGH (ref 4.0–10.5)
nRBC: 0 % (ref 0.0–0.2)

## 2020-06-06 LAB — CULTURE, BLOOD (ROUTINE X 2)
Culture: NO GROWTH
Culture: NO GROWTH
Special Requests: ADEQUATE

## 2020-06-06 LAB — BASIC METABOLIC PANEL
Anion gap: 10 (ref 5–15)
BUN: 14 mg/dL (ref 8–23)
CO2: 37 mmol/L — ABNORMAL HIGH (ref 22–32)
Calcium: 8.7 mg/dL — ABNORMAL LOW (ref 8.9–10.3)
Chloride: 89 mmol/L — ABNORMAL LOW (ref 98–111)
Creatinine, Ser: 0.47 mg/dL (ref 0.44–1.00)
GFR, Estimated: >60 mL/min (ref >60)
Glucose, Bld: 122 mg/dL — ABNORMAL HIGH (ref 70–99)
Potassium: 3.5 mmol/L (ref 3.5–5.1)
Sodium: 136 mmol/L (ref 135–145)

## 2020-06-06 NOTE — Consult Note (Signed)
Hematology/Oncology Consult note [facility] Telephone:([phone] [phone]  Patient Care Team: Baxter Hire, MD as PCP - General (Internal Medicine) Telford Nab, RN as Oncology Nurse Navigator   Name of the patient: Kim Newman  [id number]  [birth year]   Date of visit: 06/06/20 REASON FOR COSULTATION:  Likely metastatic lung cancer History of presenting illness-  87 y.o. female with PMH listed at below who presents to ER for evaluation of worsening shortness of breath associated with cough.  Upon presentation, patient was hypoxic on oxygen supplementation.  05/02/2020 CT chest without contrast showed large right apical mass invading chest wall, bony destruction of the right fifth rib, second rib, right T1 transverse process.  Surrounding groundglass opacities in the right middle lobe and right upper lobe favoring lymphangitic carcinomatosis.  5.6 cm cavitary mass in the right middle lobe with surrounding lymphocytic carcinomatosis or postobstructive pneumonitis.  Scattered bilateral pulmonary nodules.  Mediastinal and right hilar adenopathy. 05/30/2020 PET scan showed right apical mass with involvement of the chest wall, dominant right middle lobe metastatic disease with lymphangitic carcinomatosis.  Additional widespread multifocal pulmonary metastasis.  Numerous hepatic metastasis.  Possible splenic metastasis.  Nodal and peritoneal metastasis.  Multifocal osseous/soft tissue metastasis.  During this current admission, 06/01/2020, CT angio chest PE protocol showed no pulmonary embolism.  Further disease progression comparing to CT in December. Patient is currently admitted for acute respiratory failure.  Oxygen gradually increased from 5 L to now on 10 L high flow.  She also has acute on chronic diastolic heart failure.  Palliative care service has been consulted for further discussion of hospice.  Family would like to meet oncology.  Patient was scheduled  to see Dr. Cecille Aver outpatient.  She was previously seen by Dr. Cecille Aver in 2018 for iron deficiency anemia.  Patient was seen and evaluated bedside.  She appears comfortable at this point on 10 L oxygen.  Family members at the bedside.  She denies any pain currently.  Never had a biopsy to confirm clinical metastatic lung cancer.  Review of Systems  Unable to perform ROS: Acuity of condition  Constitutional: Positive for fatigue.  Respiratory: Positive for shortness of breath.     Allergies  Allergen Reactions  . Statins   . Strawberry (Diagnostic)     Patient Active Problem List   Diagnosis Date Noted  . Pressure injury of skin 06/05/2020  . Hypertension   . Acute respiratory failure with hypoxia and hypercapnia (HCC)   . Metastatic primary lung cancer, right ([place]) 06/01/2020  . Pneumonia 06/01/2020  . Iron deficiency anemia due to chronic blood loss 04/01/2016  . Symptomatic anemia 03/17/2016     Past Medical History:  Diagnosis Date  . Arthritis   . Cataract   . Cervical cancer ([place]) 1997  . GERD (gastroesophageal reflux disease)   . Hypertension   . Lung cancer ([place])   . Macular degeneration   . Obstructive sleep apnea      Past Surgical History:  Procedure Laterality Date  . ABDOMINAL HYSTERECTOMY  1997  . BREAST BIOPSY Left 2002   core   . COLONOSCOPY  2012    Social History   Socioeconomic History  . Marital status: Widowed    Spouse name: Not on file  . Number of children: Not on file  . Years of education: Not on file  . Highest education level: Not on file  Occupational History  . Not on file  Tobacco Use  . Smoking  status: Former Smoker    Types: Cigarettes  . Smokeless tobacco: Never Used  Substance and Sexual Activity  . Alcohol use: Yes  . Drug use: No  . Sexual activity: Not on file  Other Topics Concern  . Not on file  Social History Narrative  . Not on file   Social Determinants of Health   Financial Resource Strain: Not on  file  Food Insecurity: Not on file  Transportation Needs: Not on file  Physical Activity: Not on file  Stress: Not on file  Social Connections: Not on file  Intimate Partner Violence: Not on file     Family History  Problem Relation Age of Onset  . Breast cancer Neg Hx      Current Facility-Administered Medications:  .  albuterol (PROVENTIL) (2.5 MG/3ML) 0.083% nebulizer solution 2.5 mg, 2.5 mg, Nebulization, Q4H PRN, Acheampong, Warnell Bureau, MD, 2.5 mg at 06/05/20 1902 .  aspirin EC tablet 81 mg, 81 mg, Oral, BID, Acheampong, Warnell Bureau, MD, 81 mg at 06/06/20 0856 .  fluticasone (FLONASE) 50 MCG/ACT nasal spray 1 spray, 1 spray, Each Nare, BID, 1 spray at 06/06/20 0858 **AND** azelastine (ASTELIN) 0.1 % nasal spray 1 spray, 1 spray, Each Nare, BID, Renda Rolls, RPH, 1 spray at 06/05/20 2109 .  enoxaparin (LOVENOX) injection 40 mg, 40 mg, Subcutaneous, Q24H, Acheampong, Warnell Bureau, MD, 40 mg at 06/05/20 2107 .  ferrous sulfate tablet 325 mg, 325 mg, Oral, BID WC, Acheampong, Warnell Bureau, MD, 325 mg at 06/06/20 0857 .  furosemide (LASIX) injection 40 mg, 40 mg, Intravenous, BID, Annita Brod, MD, 40 mg at 06/06/20 0858 .  haloperidol lactate (HALDOL) injection 1 mg, 1 mg, Intravenous, Q6H PRN, Athena Masse, MD .  ipratropium-albuterol (DUONEB) 0.5-2.5 (3) MG/3ML nebulizer solution 3 mL, 3 mL, Nebulization, BID, Acheampong, Warnell Bureau, MD, 3 mL at 06/06/20 0733 .  loratadine (CLARITIN) tablet 10 mg, 10 mg, Oral, Daily, Acheampong, Warnell Bureau, MD, 10 mg at 06/06/20 0856 .  losartan (COZAAR) tablet 50 mg, 50 mg, Oral, Daily, Acheampong, Warnell Bureau, MD, 50 mg at 06/06/20 0857 .  metoprolol tartrate (LOPRESSOR) tablet 25 mg, 25 mg, Oral, BID, Acheampong, Warnell Bureau, MD, 25 mg at 06/06/20 0857 .  morphine 2 MG/ML injection 1 mg, 1 mg, Intravenous, Once, Mansy, Jan A, MD .  multivitamin with minerals tablet 1 tablet, 1 tablet, Oral, Daily, Acheampong, Warnell Bureau, MD, 1 tablet at 06/06/20 0856 .  pantoprazole  (PROTONIX) EC tablet 40 mg, 40 mg, Oral, Daily, Acheampong, Warnell Bureau, MD, 40 mg at 06/06/20 0857 .  vitamin B-12 (CYANOCOBALAMIN) tablet 1,000 mcg, 1,000 mcg, Oral, BID, Acheampong, Warnell Bureau, MD, 1,000 mcg at 06/06/20 0857   Physical exam:  Vitals:   06/05/20 2323 06/06/20 0549 06/06/20 0733 06/06/20 0817  BP: 134/74 (!) 170/92  (!) 157/81  Pulse: (!) 101 (!) 106  99  Resp:    16  Temp: 98.1 F (36.7 C) 97.7 F (36.5 C)  98.3 F (36.8 C)  TempSrc:      SpO2: 93% 92% 90% 93%  Weight:      Height:       Physical Exam Constitutional:      General: She is not in acute distress.    Appearance: She is ill-appearing. She is not diaphoretic.  HENT:     Head: Normocephalic and atraumatic.     Nose: Nose normal.     Mouth/Throat:     Mouth: Oropharynx is clear and moist.  Pharynx: No oropharyngeal exudate.  Eyes:     General: No scleral icterus.    Extraocular Movements: EOM normal.     Pupils: Pupils are equal, round, and reactive to light.  Cardiovascular:     Rate and Rhythm: Regular rhythm. Tachycardia present.     Heart sounds: No murmur heard.   Pulmonary:     Effort: Pulmonary effort is normal. No respiratory distress.     Breath sounds: No rales.     Comments: Decreased breath sound bilaterally Chest:     Chest wall: No tenderness.  Abdominal:     General: There is no distension.     Palpations: Abdomen is soft.     Tenderness: There is no abdominal tenderness.  Musculoskeletal:        General: No edema. Normal range of motion.     Cervical back: Normal range of motion and neck supple.  Skin:    General: Skin is warm.     Findings: No erythema.  Neurological:     Mental Status: She is alert.     Motor: No abnormal muscle tone.     Comments: Lethargic, patient is arousable.  She is able to answer yes or no to simple questions.  Psychiatric:        Mood and Affect: Affect normal.         CMP Latest Ref Rng & Units 06/06/2020  Glucose 70 - 99 mg/dL 122(H)   BUN 8 - 23 mg/dL 14  Creatinine 0.44 - 1.00 mg/dL 0.47  Sodium 135 - 145 mmol/L 136  Potassium 3.5 - 5.1 mmol/L 3.5  Chloride 98 - 111 mmol/L 89(L)  CO2 22 - 32 mmol/L 37(H)  Calcium 8.9 - 10.3 mg/dL 8.7(L)  Total Protein 6.5 - 8.1 g/dL -  Total Bilirubin 0.3 - 1.2 mg/dL -  Alkaline Phos 38 - 126 U/L -  AST 15 - 41 U/L -  ALT 0 - 44 U/L -   CBC Latest Ref Rng & Units 06/06/2020  WBC 4.0 - 10.5 K/uL 26.1(H)  Hemoglobin 12.0 - 15.0 g/dL 15.5(H)  Hematocrit 36.0 - 46.0 % 48.3(H)  Platelets 150 - 400 K/uL 194    RADIOGRAPHIC STUDIES: I have personally reviewed the radiological images as listed and agreed with the findings in the report. DG Chest 1 View  Result Date: 06/02/2020 CLINICAL DATA:  Shortness of breath. EXAM: CHEST  1 VIEW COMPARISON:  Radiograph and CT yesterday.  PET CT 3 days ago FINDINGS: Lower lung volumes from prior exam. Chronic volume loss in the right hemithorax. Pleuroparenchymal opacity at the right lung apex is well as diffuse irregular opacities throughout the right lung, progressive at the right lung base. Left basilar pulmonary pulmonary nodules on CT are not well-defined by radiograph. Chronic interstitial coarsening throughout the left hemithorax. No pneumothorax or significant pleural effusion.2 bony destructive changes involving the right first and second rib were better seen on prior CT. Surgical hardware in the left proximal humerus. IMPRESSION: 1. Progressive irregular right lung opacities particularly at the bases, favor atelectasis, infection could have a similar appearance. 2. Unchanged volume loss in the right hemithorax with pleuroparenchymal opacity at the apex corresponding to known malignancy. Underlying chronic interstitial lung changes. Many of the known pulmonary nodules are not well seen by radiograph. 3. Known osseous destructive changes about the right first and second ribs. Electronically Signed   By: Keith Rake M.D.   On: 06/02/2020 03:57    DG Chest 2 View  Result  Date: 06/01/2020 CLINICAL DATA:  Shortness of breath. Known exposure to COVID-19 this week. EXAM: CHEST - 2 VIEW COMPARISON:  CT imaging from May 22, 2020 FINDINGS: The known right apical mass is appreciated. The known right middle lobe mass is best appreciated on the lateral view. Known interstitial lung disease is identified. Given the significant chronic changes in the lungs and the lack of a comparison chest x-ray, it would be difficult to exclude mild acute on chronic ground-glass infiltrates. IMPRESSION: Given the significant chronic changes in the lungs and the lack of a comparison chest x-ray, it would be difficult to exclude mild acute on chronic ground-glass infiltrates. Known malignancy on the right as above. Known interstitial lung disease. Electronically Signed   By: Dorise Bullion III M.D   On: 06/01/2020 13:01   CT Angio Chest PE W and/or Wo Contrast  Result Date: 06/01/2020 CLINICAL DATA:  87 year old female with shortness of breath. Right apical mass. EXAM: CT ANGIOGRAPHY CHEST WITH CONTRAST TECHNIQUE: Multidetector CT imaging of the chest was performed using the standard protocol during bolus administration of intravenous contrast. Multiplanar CT image reconstructions and MIPs were obtained to evaluate the vascular anatomy. CONTRAST:  68mL OMNIPAQUE IOHEXOL 350 MG/ML SOLN COMPARISON:  Chest CT dated 05/02/2020. FINDINGS: Cardiovascular: There is no cardiomegaly or pericardial effusion. Coronary vascular calcification. There is advanced atherosclerotic calcification the thoracic aorta. No aneurysmal dilatation or dissection. Evaluation of the pulmonary arteries is limited due to respiratory motion artifact. No large or central pulmonary artery embolus identified. Mediastinum/Nodes: Mildly enlarged right hilar lymph node measures 12 mm. There is a small hiatal hernia. No mediastinal fluid collection. Lungs/Pleura: Right apical consolidation/mass as seen  previously. Multiple bilateral pulmonary nodules consistent with metastatic disease. Interval increase in the size of the nodular with prosthesis since the prior CT. There is an area of consolidation involving the right middle lobe progressed since the prior CT. There is increased interstitial prominence and diffuse ground-glass density throughout the upper lobes and right middle lobe compared to prior CT which may represent progression of lymphangitic carcinomatosis or pneumonia. Clinical correlation is recommended. There is no pleural effusion pneumothorax. The central airways are patent. Upper Abdomen: Faint hepatic hypodense lesions most consistent with metastatic disease. Musculoskeletal: Erosive changes of the right first and second ribs secondary to right apical mass. Review of the MIP images confirms the above findings. IMPRESSION: 1. No CT evidence of central pulmonary artery embolus. 2. Right apical consolidation/mass as seen previously. Interval increase in the size of the metastatic disease compared to prior CT. 3. Interval increase in the interstitial prominence and diffuse ground-glass density throughout the upper lobes and right middle lobe compared to the prior CT which may represent progression of lymphangitic carcinomatosis or pneumonia. 4. Mildly enlarged right hilar lymph node. 5. Hepatic metastatic disease. 6. Aortic Atherosclerosis (ICD10-I70.0). Electronically Signed   By: Anner Crete M.D.   On: 06/01/2020 21:56   NM PET Image Initial (PI) Skull Base To Thigh  Result Date: 05/31/2020 CLINICAL DATA:  Subsequent treatment strategy for non-small cell lung cancer. EXAM: NUCLEAR MEDICINE PET SKULL BASE TO THIGH TECHNIQUE: 7.5 mCi F-18 FDG was injected intravenously. Full-ring PET imaging was performed from the skull base to thigh after the radiotracer. CT data was obtained and used for attenuation correction and anatomic localization. Fasting blood glucose: 117 mg/dl COMPARISON:  CT chest  dated 05/02/2020 FINDINGS: Mediastinal blood pool activity: SUV max 2.0 Liver activity: SUV max NA NECK: 16 mm nodule in the right adrenal gland (  series 3/image 54), max SUV 8.4. No hypermetabolic cervical lymphadenopathy. Incidental CT findings: none CHEST: 7.1 cm right apical chest wall mass involving the right anterior 1st and 2nd rib (series 3/image 58), max SUV 17.1. This corresponds to the patient's known primary bronchogenic neoplasm with chest wall involvement. 7.3 cm medial right middle lobe mass (series 3/image 92), max SUV 17.8. 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 Numerous widespread pulmonary metastases, including a 2.0 cm nodule in the left lower lobe (series 3/image 87), max SUV 7.2. No hypermetabolic mediastinal lymphadenopathy. Right axillary nodal metastases measuring up to 12 mm short axis (series 3/image 74), max SUV 7.7. Incidental CT findings: Atherosclerotic calcifications of the aortic arch/root. Coronary atherosclerosis of the LAD and left circumflex. ABDOMEN/PELVIS: Numerous hepatic metastases, max SUV 15.7 in the right hepatic dome and 10.2 in the lateral segment left hepatic lobe. Possible focal hypermetabolism in the medial spleen, max SUV 4.4, raising concern for a splenic metastasis. No focal hypermetabolism in the pancreas or adrenal glands. Upper abdominal/retroperitoneal lymphadenopathy, including a 14 mm short axis right para-aortic node (series 3/image 145) with max SUV 11.2 and a 13 mm short axis left para-aortic node (series 3/image 152) with max SUV 7.6. Two peritoneal implants along the jejunal mesentery in the left mid abdomen measuring 2.9 and 3.3 cm (series 3/image 167), max SUV 13.9. Additional peritoneal implants, including a dominant 3.7 cm lesion in the left lower pelvis (series 3/image 202), max SUV 18.6. Small volume pelvic ascites. Incidental CT findings: Right lower pole renal cyst. Atherosclerotic calcifications  abdominal aorta and branch vessels. Mild sigmoid diverticulosis. SKELETON: Multifocal osseous metastases, including: --Right proximal humerus, max SUV 3.9 --Left posterior 3rd rib, max SUV 4.6 --T7 vertebral body, max SUV 6.2 --T8 posterior spinal process, max SUV 11.0 --Posterior left iliac bone, max SUV 4.4 --Left femoral head, max SUV 14.0 --Right inferior pubic ramus, max SUV 14.3 Soft tissue metastases, including: --Right breast, max SUV 5.3 --Left anterior chest wall, max SUV 10.0 --Right gluteal region, max SUV 11.9 Incidental CT findings: ORIF of the left shoulder/proximal humerus. Degenerative changes of the visualized thoracolumbar spine. IMPRESSION: Right apical mass with involvement of the chest wall, corresponding to the patient's known primary bronchogenic neoplasm. 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 Additional widespread multifocal pulmonary metastases. Numerous hepatic metastases. Possible splenic metastasis. Nodal and peritoneal metastases. Multifocal osseous and soft tissue metastases. Electronically Signed   By: Julian Hy M.D.   On: 05/31/2020 08:41   DG Chest Port 1 View  Result Date: 06/04/2020 CLINICAL DATA:  Shortness of breath EXAM: PORTABLE CHEST 1 VIEW COMPARISON:  06/02/2020 FINDINGS: Extensive bilateral airspace opacities, worsening slightly since prior study. Elevation of the right hemidiaphragm. Small right pleural effusion. Heart is normal size. Aortic atherosclerosis. IMPRESSION: Worsening diffuse bilateral airspace disease. Small right effusion. Electronically Signed   By: Rolm Baptise M.D.   On: 06/04/2020 23:57   ECHOCARDIOGRAM COMPLETE  Result Date: 06/02/2020    ECHOCARDIOGRAM REPORT   Patient Name:   RYLEI MASELLA Date of Exam: 06/02/2020 Medical Rec #:  [id number]       Height:       62.0 in Accession #:    [id number]      Weight:       147.0 lb Date of Birth:  February 20, 1928        BSA:          1.677 m Patient Age:     63 years  BP:           134/63 mmHg Patient Gender: F               HR:           97 bpm. Exam Location:  [facility] Procedure: 2D Echo, Cardiac Doppler and Color Doppler Indications:     Abnormal ECG R94.31  History:         Patient has no prior history of Echocardiogram examinations.                  Risk Factors:Hypertension.  Sonographer:     Alyse Low Roar Referring Phys:  [id number] [place] Diagnosing Phys: Ida Rogue MD IMPRESSIONS  1. Left ventricular ejection fraction, by estimation, is 55 to 60%. The left ventricle has normal function. The left ventricle has no regional wall motion abnormalities. Left ventricular diastolic parameters are consistent with Grade I diastolic dysfunction (impaired relaxation).  2. Right ventricular systolic function is normal. The right ventricular size is normal. There is normal pulmonary artery systolic pressure. The estimated right ventricular systolic pressure is 58.5 mmHg.  3. The mitral valve is normal in structure. Mild to moderate mitral valve regurgitation. No evidence of mitral stenosis.  4. The aortic valve was not well visualized. FINDINGS  Left Ventricle: Left ventricular ejection fraction, by estimation, is 55 to 60%. The left ventricle has normal function. The left ventricle has no regional wall motion abnormalities. The left ventricular internal cavity size was normal in size. There is  no left ventricular hypertrophy. Left ventricular diastolic parameters are consistent with Grade I diastolic dysfunction (impaired relaxation). Right Ventricle: The right ventricular size is normal. No increase in right ventricular wall thickness. Right ventricular systolic function is normal. There is normal pulmonary artery systolic pressure. The tricuspid regurgitant velocity is 2.57 m/s, and  with an assumed right atrial pressure of 5 mmHg, the estimated right ventricular systolic pressure is 27.7 mmHg. Left Atrium: Left atrial size was normal in size. Right Atrium:  Right atrial size was normal in size. Pericardium: There is no evidence of pericardial effusion. Mitral Valve: The mitral valve is normal in structure. Mild mitral annular calcification. Mild to moderate mitral valve regurgitation. No evidence of mitral valve stenosis. Tricuspid Valve: The tricuspid valve is normal in structure. Tricuspid valve regurgitation is mild . No evidence of tricuspid stenosis. Aortic Valve: The aortic valve was not well visualized. Aortic valve regurgitation is not visualized. No aortic stenosis is present. Aortic valve peak gradient measures 5.3 mmHg. Pulmonic Valve: The pulmonic valve was normal in structure. Pulmonic valve regurgitation is not visualized. No evidence of pulmonic stenosis. Aorta: The aortic root is normal in size and structure. Venous: The inferior vena cava is normal in size with greater than 50% respiratory variability, suggesting right atrial pressure of 3 mmHg. IAS/Shunts: No atrial level shunt detected by color flow Doppler.  LEFT VENTRICLE PLAX 2D LVIDd:         4.30 cm  Diastology LVIDs:         3.00 cm  LV e' medial:    23.70 cm/s LV PW:         0.90 cm  LV E/e' medial:  4.4 LV IVS:        0.90 cm  LV e' lateral:   10.40 cm/s LVOT diam:     1.80 cm  LV E/e' lateral: 10.0 LVOT Area:     2.54 cm  RIGHT VENTRICLE RV Mid diam:  3.40 cm RV S prime:     17.20 cm/s LEFT ATRIUM           Index       RIGHT ATRIUM           Index LA diam:      3.90 cm 2.33 cm/m  RA Area:     12.70 cm LA Vol (A4C): 41.5 ml 24.74 ml/m RA Volume:   29.50 ml  17.59 ml/m  AORTIC VALVE                PULMONIC VALVE AV Area (Vmax): 1.74 cm    PV Vmax:        0.92 m/s AV Vmax:        115.00 cm/s PV Peak grad:   3.4 mmHg AV Peak Grad:   5.3 mmHg    RVOT Peak grad: 2 mmHg LVOT Vmax:      78.60 cm/s  AORTA Ao Root diam: 2.60 cm MITRAL VALVE                TRICUSPID VALVE MV Area (PHT): 4.10 cm     TR Peak grad:   26.4 mmHg MV Decel Time: 185 msec     TR Vmax:        257.00 cm/s MV E velocity:  104.00 cm/s MV A velocity: 129.00 cm/s  SHUNTS MV E/A ratio:  0.81         Systemic Diam: 1.80 cm MV A Prime:    14.9 cm/s Ida Rogue MD Electronically signed by Ida Rogue MD Signature Date/Time: 06/02/2020/5:41:58 PM    Final     Assessment and plan-   #Acute hypoxic respiratory failure, with images concerning of widely metastatic lung cancer with chest wall, bone, liver, possible spleen, peritoneal soft tissue etc. Prognosis extremely poor given her poor performance status, advanced age, other medical problems. Patient and daughter have decided not to proceed with biopsy or any aggressive cancer treatments.  I think this is a very reasonable decision.  She will be a very poor candidate for any aggressive cancer treatment.  Palliative service has been consulted for discussion of hospice. Continue supportive care, pain control per palliative care service   Thank you for allowing me to participate in the care of this patient.    Earlie Server, MD, PhD Hematology Oncology [facility] Pager- [phone] 06/06/2020

## 2020-06-06 NOTE — Care Management Important Message (Signed)
Important Message  Patient Details  Name: Kim Newman MRN: [id number] Date of Birth: [birth year]   Medicare Important Message Given:  Yes     Juliann Pulse A [name] 06/06/2020, 10:47 AM

## 2020-06-06 NOTE — Progress Notes (Signed)
PROGRESS NOTE  Kim Newman OMV:[id number] DOB: 1928/05/21 DOA: 06/01/2020 PCP: Baxter Hire, MD  HPI/Recap of past 65 hours: 87 year old female with past medical history of hypertension, obstructive sleep apnea and had a recent diagnosis of lung cancer in mid December who had been in her usual state of health, not on oxygen, started having progressively worsening shortness of breath over the past week and then came into the emergency room on 1/8 for further evaluation.  COVID-negative.  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  Procalcitonin minimal at 0.15 with a normal lactic acid level on admission.  Patient started on Rocephin and Zithromax and placed on 5 L of oxygen.  Also noted to have an elevated troponin mild.  She was seen by cardiology.  Felt to be demand ischemia.  On 1/11, oxygen needs have slowly increased up to 10 L high flow and on night of 1/11, patient noted to be in respiratory distress requiring 12 L nasal cannula.  Also found to have bibasilar crackles given 40 mg of IV Lasix plus 1 mg of morphine which did help her breathing.  Also found to be in acute diastolic CHF and started on IV Lasix.  Patient has diuresed over 2 L of fluid since.  Patient and her daughter met with palliative care on 1/12 and want to meet with oncology (was supposed to have an appointment on 1/12 as an outpatient) to discuss prognosis before making decisions about whether to go comfort care  Today, she is feeling a little bit better.  Still requiring 12 L high flow nasal cannula.  Denies any acute pain.    Assessment/Plan: Principal Problem:   Acute respiratory failure with hypoxia and hypercapnia (HCC) secondary to extensive lung cancer plus acute on chronic diastolic heart failure: Less likely that this is  any type of infectious process given normal procalcitonin level by following day.  White count is more elevated due to stress margination.  BNP checked on 1/12 and found to be markedly elevated at 1013.  Have started IV Lasix which should hopefully ease her breathing slightly.  She has diuresed 2 L to date..  Echocardiogram done on 1/9 noted diastolic dysfunction.   Active Problems:   Pressure injury of skin:  Pressure Injury 06/03/20 Sacrum Medial Stage 2 -  Partial thickness loss of dermis presenting as a shallow open injury with a red, pink wound bed without slough. (Active)  06/03/20 0900  Location: Sacrum  Location Orientation: Medial  Staging: Stage 2 -  Partial thickness loss of dermis presenting as a shallow open injury with a red, pink wound bed without slough.  Wound Description (Comments):   Present on Admission: Yes  Overweight: Patient is criteria for BMI greater than 25    Hypertension: Blood pressure stable.  Code Status: DNR  Family Communication: Daughter at bedside  Disposition Plan: To be determined following oncology meeting   Consultants:  Palliative care  Oncology  Procedures:  Echocardiogram done 6/28: Grade 1 diastolic dysfunction.  Preserved ejection fraction.  Mild to moderate mitral regurg  Antimicrobials:  IV Rocephin and Zithromax 1/8- 1/12  DVT prophylaxis: Lovenox   Objective: Vitals:   06/06/20 0733 06/06/20 0817  BP:  (!) 157/81  Pulse:  99  Resp:  16  Temp:  98.3 F (36.8 C)  SpO2: 90% 93%  Intake/Output Summary (Last 24 hours) at 06/06/2020 1200 Last data filed at 06/06/2020 1037 Gross per 24 hour  Intake 120 ml  Output 800 ml  Net -680 ml   Filed Weights   06/01/20 1216 06/03/20 0729  Weight: 66.7 kg 66.7 kg   Body mass index is 26.89 kg/m.  Exam:   General: Alert and oriented x2, no acute distress  HEENT: Normocephalic atraumatic, mucous brains are moist  Cardiovascular: Regular rate and rhythm,  S1-S2  Respiratory: Decreased breath sounds throughout, scattered Rales  Abdomen: Soft, nontender, nondistended, positive bowel sounds  Musculoskeletal: No clubbing or cyanosis, 1+ pitting edema bilaterally  Psychiatry: Appropriate, no evidence of psychosis   Data Reviewed: CBC: Recent Labs  Lab 06/02/20 0320 06/03/20 0417 06/04/20 0622 06/05/20 0318 06/06/20 0204  WBC 36.3* 26.9* 28.7* 26.5* 26.1*  NEUTROABS 20.9*  --   --   --   --   HGB 15.2* 13.6 14.9 15.1* 15.5*  HCT 47.9* 42.8 48.2* 47.6* 48.3*  MCV 89.0 89.0 90.6 89.8 89.6  PLT 213 191 215 201 774   Basic Metabolic Panel: Recent Labs  Lab 06/01/20 1223 06/02/20 0320 06/03/20 0417 06/04/20 0622 06/05/20 0318 06/06/20 0204  NA 133* 133*  --  138 139 136  K 3.9 4.2  --  3.9 3.8 3.5  CL 97* 97*  --  95* 93* 89*  CO2 24 26  --  30 35* 37*  GLUCOSE 142* 144*  --  132* 150* 122*  BUN 19 15  --  _0 CREATININE 0.75 0.63  --  0.56 0.49 0.47  CALCIUM 9.1 8.8*  --  9.1 8.7* 8.7*  MG  --   --  1.8  --  1.9  --    GFR: Estimated Creatinine Clearance: 41.8 mL/min (by C-G formula based on SCr of 0.47 mg/dL). Liver Function Tests: Recent Labs  Lab 06/05/20 0318  AST 62*  ALT 55*  ALKPHOS 101  BILITOT 0.4  PROT 6.4*  ALBUMIN 2.5*   No results for input(s): LIPASE, AMYLASE in the last 168 hours. No results for input(s): AMMONIA in the last 168 hours. Coagulation Profile: Recent Labs  Lab 06/01/20 1223  INR 1.1   Cardiac Enzymes: No results for input(s): CKTOTAL, CKMB, CKMBINDEX, TROPONINI in the last 168 hours. BNP (last 3 results) No results for input(s): PROBNP in the last 8760 hours. HbA1C: No results for input(s): HGBA1C in the last 72 hours. CBG: Recent Labs  Lab 05/30/20 1413  GLUCAP 117*   Lipid Profile: No results for input(s): CHOL, HDL, LDLCALC, TRIG, CHOLHDL, LDLDIRECT in the last 72 hours. Thyroid Function Tests: No results for input(s): TSH, T4TOTAL, FREET4, T3FREE, THYROIDAB  in the last 72 hours. Anemia Panel: No results for input(s): VITAMINB12, FOLATE, FERRITIN, TIBC, IRON, RETICCTPCT in the last 72 hours. Urine analysis:    Component Value Date/Time   COLORURINE YELLOW (A) 06/02/2020 1545   APPEARANCEUR CLEAR (A) 06/02/2020 1545   APPEARANCEUR Hazy 12/14/2013 1133   LABSPEC 1.044 (H) 06/02/2020 1545   LABSPEC 1.008 12/14/2013 1133   PHURINE 5.0 06/02/2020 1545   GLUCOSEU NEGATIVE 06/02/2020 1545   GLUCOSEU Negative 12/14/2013 1133   HGBUR NEGATIVE 06/02/2020 [place] 06/02/2020 1545   BILIRUBINUR Negative 12/14/2013 1133   KETONESUR 5 (A) 06/02/2020 1545   PROTEINUR NEGATIVE 06/02/2020 1545   NITRITE NEGATIVE 06/02/2020 1545   LEUKOCYTESUR NEGATIVE 06/02/2020 1545   LEUKOCYTESUR 3+ 12/14/2013 1133   Sepsis Labs: _1 (procalcitonin:4,lacticidven:4)  )  Recent Results (from the past 240 hour(s))  Blood culture (routine x 2)     Status: None   Collection Time: 06/01/20  7:02 PM   Specimen: BLOOD  Result Value Ref Range Status   Specimen Description BLOOD LEFT ANTECUBITAL  Final   Special Requests   Final    BOTTLES DRAWN AEROBIC AND ANAEROBIC Blood Culture adequate volume   Culture   Final    NO GROWTH 5 DAYS Performed at [facility], [address] [place], [place] [address]    Report Status 06/06/2020 FINAL  Final  Blood culture (routine x 2)     Status: None   Collection Time: 06/01/20  8:09 PM   Specimen: BLOOD  Result Value Ref Range Status   Specimen Description BLOOD RIGHT ANTECUBITAL  Final   Special Requests   Final    BOTTLES DRAWN AEROBIC AND ANAEROBIC Blood Culture results may not be optimal due to an excessive volume of blood received in culture bottles   Culture   Final    NO GROWTH 5 DAYS Performed at [facility], [address] [place], [place] [address]    Report Status 06/06/2020 FINAL  Final  Resp Panel by RT-PCR (Flu A&B, Covid) Nasopharyngeal Swab     Status: None    Collection Time: 06/01/20  8:55 PM   Specimen: Nasopharyngeal Swab; Nasopharyngeal(NP) swabs in vial transport medium  Result Value Ref Range Status   SARS Coronavirus 2 by RT PCR NEGATIVE NEGATIVE Final    Comment: (NOTE) SARS-CoV-2 target nucleic acids are NOT DETECTED.  The SARS-CoV-2 RNA is generally detectable in upper respiratory specimens during the acute phase of infection. The lowest concentration of SARS-CoV-2 viral copies this assay can detect is 138 copies/mL. A negative result does not preclude SARS-Cov-2 infection and should not be used as the sole basis for treatment or other patient management decisions. A negative result may occur with  improper specimen collection/handling, submission of specimen other than nasopharyngeal swab, presence of viral mutation(s) within the areas targeted by this assay, and inadequate number of viral copies(<138 copies/mL). A negative result must be combined with clinical observations, patient history, and epidemiological information. The expected result is Negative.  Fact Sheet for Patients:  EntrepreneurPulse.com.au  Fact Sheet for Healthcare Providers:  IncredibleEmployment.be  This test is no t yet approved or cleared by the [place] FDA and  has been authorized for detection and/or diagnosis of SARS-CoV-2 by FDA under an Emergency Use Authorization (EUA). This EUA will remain  in effect (meaning this test can be used) for the duration of the COVID-19 declaration under Section 564(b)(1) of the Act, 21 U.S.C.section 360bbb-3(b)(1), unless the authorization is terminated  or revoked sooner.       Influenza A by PCR NEGATIVE NEGATIVE Final   Influenza B by PCR NEGATIVE NEGATIVE Final    Comment: (NOTE) The Xpert Xpress SARS-CoV-2/FLU/RSV plus assay is intended as an aid in the diagnosis of influenza from Nasopharyngeal swab specimens and should not be used as a sole basis for treatment.  Nasal washings and aspirates are unacceptable for Xpert Xpress SARS-CoV-2/FLU/RSV testing.  Fact Sheet for Patients: EntrepreneurPulse.com.au  Fact Sheet for Healthcare Providers: IncredibleEmployment.be  This test is not yet approved or cleared by the [place] FDA and has been authorized for detection and/or diagnosis of SARS-CoV-2 by FDA under an Emergency Use Authorization (EUA). This EUA will remain in effect (meaning this test can be used) for the duration of the COVID-19 declaration  under Section 564(b)(1) of the Act, 21 U.S.C. section 360bbb-3(b)(1), unless the authorization is terminated or revoked.  Performed at [facility], [address], [place], [place] [address]   Urine Culture     Status: Abnormal   Collection Time: 06/02/20  3:45 PM   Specimen: Urine, Random  Result Value Ref Range Status   Specimen Description   Final    URINE, RANDOM Performed at [facility], [address] [place], [place] [address]    Special Requests   Final    NONE Performed at [facility], [address] [place], [place] [address]    Culture (A)  Final    <10,000 COLONIES/mL INSIGNIFICANT GROWTH Performed at [facility], [place] [address], [place], [place] [address]    Report Status 06/04/2020 FINAL  Final      Studies: No results found.  Scheduled Meds: . aspirin EC  81 mg Oral BID  . fluticasone  1 spray Each Nare BID   And  . azelastine  1 spray Each Nare BID  . enoxaparin (LOVENOX) injection  40 mg Subcutaneous Q24H  . ferrous sulfate  325 mg Oral BID WC  . furosemide  40 mg Intravenous BID  . ipratropium-albuterol  3 mL Nebulization BID  . loratadine  10 mg Oral Daily  . losartan  50 mg Oral Daily  . metoprolol tartrate  25 mg Oral BID  .  morphine injection  1 mg Intravenous Once  . multivitamin with minerals  1 tablet Oral Daily  . pantoprazole  40 mg Oral Daily  . vitamin B-12  1,000  mcg Oral BID    Continuous Infusions:    LOS: 5 days     Annita Brod, MD [facility]   06/06/2020, 12:00 PM

## 2020-06-07 ENCOUNTER — Ambulatory Visit: Payer: Medicare PPO

## 2020-06-07 DIAGNOSIS — J189 Pneumonia, unspecified organism: Secondary | ICD-10-CM | POA: Diagnosis not present

## 2020-06-07 DIAGNOSIS — C799 Secondary malignant neoplasm of unspecified site: Secondary | ICD-10-CM | POA: Diagnosis not present

## 2020-06-07 DIAGNOSIS — R0600 Dyspnea, unspecified: Secondary | ICD-10-CM

## 2020-06-07 DIAGNOSIS — G893 Neoplasm related pain (acute) (chronic): Secondary | ICD-10-CM | POA: Diagnosis not present

## 2020-06-07 DIAGNOSIS — Z66 Do not resuscitate: Secondary | ICD-10-CM

## 2020-06-07 DIAGNOSIS — I10 Essential (primary) hypertension: Secondary | ICD-10-CM | POA: Diagnosis not present

## 2020-06-07 DIAGNOSIS — I5033 Acute on chronic diastolic (congestive) heart failure: Secondary | ICD-10-CM | POA: Diagnosis not present

## 2020-06-07 DIAGNOSIS — J9601 Acute respiratory failure with hypoxia: Secondary | ICD-10-CM | POA: Diagnosis not present

## 2020-06-07 MED ORDER — LIDOCAINE 5 % EX PTCH: 1.0000 | MEDICATED_PATCH | CUTANEOUS | 0 refills | Status: AC

## 2020-06-07 MED ORDER — SENNA 8.6 MG PO TABS: 1.0000 | ORAL_TABLET | Freq: Every day | ORAL | Status: DC | PRN

## 2020-06-07 MED ORDER — FUROSEMIDE 20 MG PO TABS: 20.0000 mg | ORAL_TABLET | Freq: Every day | ORAL | 11 refills | Status: AC

## 2020-06-07 MED ORDER — LIDOCAINE 5 % EX PTCH
1.0000 | MEDICATED_PATCH | CUTANEOUS | Status: DC
  Administered 2020-06-07 – 2020-06-10 (×4): 1 via TRANSDERMAL
  Filled 2020-06-07 (×5): qty 1

## 2020-06-07 MED ORDER — ALPRAZOLAM 0.25 MG PO TABS: 0.2500 mg | ORAL_TABLET | Freq: Three times a day (TID) | ORAL | Status: DC | PRN

## 2020-06-07 MED ORDER — MORPHINE SULFATE (CONCENTRATE) 10 MG/0.5ML PO SOLN: 5.0000 mg | ORAL | Status: DC | PRN

## 2020-06-07 MED ORDER — OMEPRAZOLE 20 MG PO CPDR: 20.0000 mg | DELAYED_RELEASE_CAPSULE | Freq: Every day | ORAL | Status: AC

## 2020-06-07 MED ORDER — ALPRAZOLAM 0.25 MG PO TABS: 0.2500 mg | ORAL_TABLET | Freq: Three times a day (TID) | ORAL | 0 refills | Status: AC | PRN

## 2020-06-07 MED ORDER — MORPHINE SULFATE (CONCENTRATE) 10 MG/0.5ML PO SOLN: 5.0000 mg | ORAL | 0 refills | Status: AC | PRN

## 2020-06-07 NOTE — Progress Notes (Signed)
Patients vital signs flagged Yellow MEWS. At 1524, B/P was 96/66 and HR 121. Notified MD and charge nurse, no new orders as of yet

## 2020-06-07 NOTE — Progress Notes (Addendum)
[facility] Liaison Note:  New referral for [facility] hospice services at home received from [facility]. Patient information sent to referral. Hospice eligibility has been confirmed.  Writer met in the room with patent and her daughter Juliann Pulse to initiate education regarding hospice services, philosophy and team approach to care with understanding voiced. Questions answered. DME needs discussed and the following has been ordered: Hospital bed, oxygen at 12 liters, nebulizer machine, BSC and over bed table. Juliann Pulse has requested DME delivery tomorrow 1/15. Patient will require non-emergent transport at discharge, with signed out of facility DNR in place. TOC [place] updated. Thank you for the opportunity to be involved in the care of this patient and her family. Flo Shanks BSN, RN, [facility] [employer] [phone]

## 2020-06-07 NOTE — TOC Initial Note (Signed)
Transition of Care [facility]) - Initial/Assessment Note    Patient Details  Name: Kim Newman MRN: [id number] Date of Birth: 05-21-1928  Transition of Care [facility]) CM/SW Contact:    Shelbie Ammons, RN Phone Number: 06/07/2020, 12:44 PM  Clinical Narrative:   RNCM received TOC consult for Hospice care at home. RNCM reached out to patient's daughter Tye [place] and she returned call. Tye [place] reports that it is her and her family's plan to take patient back to her own home in [place]. They request services through [facility]. They would like a hospital bed, over bed table, BSC, and oxygen. She would like to take the patient home tomorrow if they can get the equipment delivered.  RNCM reached out to Santiago Glad with [employer] and referral was given.          Expected Discharge Plan: Home w Hospice Care Barriers to Discharge: Equipment Delay   Patient Goals and CMS Choice        Expected Discharge Plan and Services Expected Discharge Plan: [place] arrangements for the past 2 months: Single Family Home                                      Prior Living Arrangements/Services Living arrangements for the past 2 months: Single Family Home   Patient language and need for interpreter reviewed:: Yes Do you feel safe going back to the place where you live?: Yes      Need for Family Participation in Patient Care: Yes (Comment) Care giver support system in place?: Yes (comment)   Criminal Activity/Legal Involvement Pertinent to Current Situation/Hospitalization: No - Comment as needed  Activities of Daily Living Home Assistive Devices/Equipment: Eyeglasses,Bedside commode/3-in-1,Grab bars in shower,Oxygen,Nebulizer ADL Screening (condition at time of admission) Patient's cognitive ability adequate to safely complete daily activities?: No Is the patient deaf or have difficulty hearing?: No Does the patient have difficulty seeing, even when wearing  glasses/contacts?: Yes Does the patient have difficulty concentrating, remembering, or making decisions?: No Patient able to express need for assistance with ADLs?: Yes Does the patient have difficulty dressing or bathing?: Yes Independently performs ADLs?: No Communication: Independent Is this a change from baseline?: Pre-admission baseline Dressing (OT): Needs assistance Is this a change from baseline?: Pre-admission baseline Grooming: Needs assistance Is this a change from baseline?: Pre-admission baseline Feeding: Needs assistance Is this a change from baseline?: Pre-admission baseline Bathing: Needs assistance Is this a change from baseline?: Pre-admission baseline Toileting: Needs assistance Is this a change from baseline?: Pre-admission baseline In/Out Bed: Needs assistance Is this a change from baseline?: Pre-admission baseline Walks in Home: Needs assistance Is this a change from baseline?: Pre-admission baseline Does the patient have difficulty walking or climbing stairs?: Yes Weakness of Legs: Both Weakness of Arms/Hands: Both  Permission Sought/Granted                  Emotional Assessment         Alcohol / Substance Use: Not Applicable Psych Involvement: No (comment)  Admission diagnosis:  Shortness of breath [R06.02] Pneumonia [J18.9] Hypoxia [R09.02] Patient Active Problem List   Diagnosis Date Noted  . Metastatic malignant neoplasm ([place])   . Liver metastasis ([place])   . Lung mass   . Goals of care, counseling/discussion   . Pressure injury of skin 06/05/2020  . Hypertension   . Acute respiratory  failure with hypoxia and hypercapnia ([place])   . Metastatic primary lung cancer, right ([place]) 06/01/2020  . Pneumonia 06/01/2020  . Iron deficiency anemia due to chronic blood loss 04/01/2016  . Symptomatic anemia 03/17/2016   PCP:  Baxter Hire, MD Pharmacy:   [id number] [place], [address] [place] [address] Phone:  [phone] Fax: [phone]     Social Determinants of Health (SDOH) Interventions    Readmission Risk Interventions No flowsheet data found.

## 2020-06-07 NOTE — Plan of Care (Signed)
Pt alert and oriented x4, on 12L HFNC afebrile, denied any pain, nausea, SOB, or headache. Daughter at bedside at the beginning of shift but left before 8pm. Pt tolerated PO medications. Scheduled metoprolol given for tachycardia. Call bell within reach  06/06/20 2111  Vitals  Temp 98 F (36.7 C)  Temp Source Oral  BP (!) 158/83  MAP (mmHg) 106  BP Method Automatic  Pulse Rate (!) 104  Pulse Rate Source Monitor  Resp 19  MEWS COLOR  MEWS Score Color Green  Oxygen Therapy  SpO2 91 %  O2 Device HFNC  O2 Flow Rate (L/min) 12 L/min  Pain Assessment  Pain Scale 0-10  Pain Score 0  MEWS Score  MEWS Temp 0  MEWS Systolic 0  MEWS Pulse 1  MEWS RR 0  MEWS LOC 0  MEWS Score 1   Problem: Education: Goal: Knowledge of General Education information will improve Description: Including pain rating scale, medication(s)/side effects and non-pharmacologic comfort measures Outcome: Progressing   Problem: Activity: Goal: Ability to tolerate increased activity will improve Outcome: Progressing

## 2020-06-07 NOTE — Progress Notes (Deleted)
Pt alert

## 2020-06-07 NOTE — Discharge Summary (Addendum)
Discharge Summary  Kim Newman VQQ:[id number] DOB: [birth year]  PCP: Baxter Hire, MD  Admit date: 06/01/2020 Anticipated Discharge date: 06/08/2020  Time spent: 25 minutes   Recommendations for Outpatient Follow-up:  1. Patient going home with hospice. 2. New medication: Lidocaine patch on for 12 hours, off for 12 hours applied to right shoulder. 3. New medication: Lasix 20 mg p.o. daily 4. New medication: Xanax 0.5 mg p.o. 3 times daily as needed 5. New medication: Roxanol 5 to 10 mg under tongue 3 times daily as needed for work of breathing 6. Patient will discontinue the following medications: Aspirin, calcium carbonate, iron, fish oil, multivitamin  Discharge Diagnoses:  Active Hospital Problems   Diagnosis Date Noted  . Acute respiratory failure with hypoxia and hypercapnia (HCC)   . Metastatic malignant neoplasm ([place])   . Liver metastasis ([place])   . Lung mass   . Goals of care, counseling/discussion   . Pressure injury of skin 06/05/2020  . Hypertension   . Pneumonia 06/01/2020  . Symptomatic anemia 03/17/2016    Resolved Hospital Problems  No resolved problems to display.    Discharge Condition: Home with hospice, long-term prognosis limited  Diet recommendation: Regular diet, comfort feeds  Vitals:   06/07/20 0824 06/07/20 1239  BP: (!) 106/59 117/79  Pulse: (!) 109 (!) 103  Resp: 16 16  Temp: 97.7 F (36.5 C) 97.7 F (36.5 C)  SpO2: 91% 93%    History of present illness:  87 year old female with past medical history of hypertension, obstructive sleep apnea and had a recent diagnosis of lung cancer in mid December who had been in her usual state of health, not on oxygen, started having progressively worsening shortness of breath over the past week and then came into the emergency room on 1/8 for further evaluation.  COVID-negative.  Emergency room, medically markedly hypoxic I & D white count elevated at 36,000 CT scan of the chest concerning for right  upper lobe mass with increased groundglass opacities suggestive of lymphangitic carcinomatosis versus pneumonia.  Procalcitonin minimal at 0.15 with a normal lactic acid level on admission.  Patient started on Rocephin and Zithromax and placed on 5 L of oxygen. Also noted to have an elevated troponin mild.  She was seen by cardiology.  Felt to be demand ischemia.  On 1/11, oxygen needs have slowly increased up to 10 L high flow and on night of 1/11, patient noted to be in respiratory distress requiring 12 L nasal cannula.  Also found to have bibasilar crackles given 40 mg of IV Lasix plus 1 mg of morphine which did help her breathing.  Also found to be in acute diastolic CHF and started on IV Lasix.  Patient has diuresed over 3 L of fluid since.  After meeting with palliative care and oncology and realizing the poor prognosis or cancer diagnosis had, patient and her family opted to go home with hospice.  She will continue on Lasix  as well as other medications for comfort.  Hospice contacted and hospital bed and other equipment should be delivered and ready by 1/15, which is when patient will be discharged.  Hospital Course:  Acute respiratory failure with hypoxia and hypercapnia (HCC) secondary to extensive lung cancer plus acute on chronic diastolic heart failure: Less likely that this is any type of infectious process given normal procalcitonin level by following day.  White count more elevated due to stress margination.  BNP checked on 1/12 and found to be markedly elevated at  1013.  Have started IV Lasix which should hopefully ease her breathing slightly.  She has diuresed 2 L to date..  Echocardiogram done on 1/9 noted diastolic dysfunction.  With normal procalcitonin, antibiotics discontinued.  Pressure injury of skin: Pressure Injury 06/03/20 Sacrum Medial Stage 2 -  Partial thickness loss of dermis presenting as a shallow open injury with a red, pink wound bed without slough. (Active)  06/03/20  0900  Location: Sacrum  Location Orientation: Medial  Staging: Stage 2 -  Partial thickness loss of dermis presenting as a shallow open injury with a red, pink wound bed without slough.  Wound Description (Comments):   Present on Admission: Yes   Hypertension: Blood pressure stable   Consultants:  Palliative care  Oncology  Procedures:  Echocardiogram done 9/79: Grade 1 diastolic dysfunction.  Preserved ejection fraction.  Mild to moderate mitral regurg  Discharge Exam: BP 117/79 (BP Location: Right Arm)   Pulse (!) 103   Temp 97.7 F (36.5 C)   Resp 16   Ht 5\' 2"  (1.575 m)   Wt 61.5 kg   SpO2 93%   BMI 24.80 kg/m   General: Alert and oriented x2, no acute distress Cardiovascular: Regular rate, borderline tachycardia Respiratory: Decreased breath sounds throughout with scattered Rales  Discharge Instructions You were cared for by a hospitalist during your hospital stay. If you have any questions about your discharge medications or the care you received while you were in the hospital after you are discharged, you can call the unit and asked to speak with the hospitalist on call if the hospitalist that took care of you is not available. Once you are discharged, your primary care physician will handle any further medical issues. Please note that NO REFILLS for any discharge medications will be authorized once you are discharged, as it is imperative that you return to your primary care physician (or establish a relationship with a primary care physician if you do not have one) for your aftercare needs so that they can reassess your need for medications and monitor your lab values.   Allergies as of 06/07/2020      Reactions   Statins    Strawberry (diagnostic)       Medication List    STOP taking these medications   aspirin EC 81 MG tablet   calcium carbonate 1250 (500 Ca) MG tablet Commonly known as: OS-CAL - dosed in mg of elemental calcium   cetirizine 10 MG  tablet Commonly known as: ZYRTEC   cholecalciferol 25 MCG (1000 UNIT) tablet Commonly known as: VITAMIN D3   Dymista 137-50 MCG/ACT Susp Generic drug: Azelastine-Fluticasone   ferrous sulfate 325 (65 FE) MG tablet   Fish Oil 1000 MG Caps   PRESERVISION AREDS 2 PO   vitamin B-12 1000 MCG tablet Commonly known as: CYANOCOBALAMIN     TAKE these medications   Advair Diskus 100-50 MCG/DOSE Aepb Generic drug: Fluticasone-Salmeterol Inhale 2 puffs into the lungs 2 (two) times daily.   albuterol 108 (90 Base) MCG/ACT inhaler Commonly known as: VENTOLIN HFA Inhale 2 puffs into the lungs 4 (four) times daily as needed for wheezing or shortness of breath.   ALPRAZolam 0.25 MG tablet Commonly known as: XANAX Take 1 tablet (0.25 mg total) by mouth 3 (three) times daily as needed for anxiety or sleep.   Denta 5000 Plus 1.1 % Crea dental cream Generic drug: sodium fluoride Take 1 application by mouth daily.   furosemide 20 MG tablet Commonly known as: Lasix  Take 1 tablet (20 mg total) by mouth daily.   lidocaine 5 % Commonly known as: LIDODERM Place 1 patch onto the skin daily. Place on R shoulder.  Remove & Discard patch within 12 hours or as directed by MD Start taking on: June 08, 2020   morphine CONCENTRATE 10 MG/0.5ML Soln concentrated solution Place 0.25-0.5 mLs (5-10 mg total) under the tongue every 3 (three) hours as needed for moderate pain (dyspnea/air hunger/tachypnea).   omeprazole 20 MG capsule Commonly known as: PRILOSEC Take 1 capsule (20 mg total) by mouth daily. What changed: how much to take   telmisartan 40 MG tablet Commonly known as: MICARDIS Take 40 mg by mouth daily.   traMADol 50 MG tablet Commonly known as: ULTRAM Take 50 mg by mouth every 12 (twelve) hours as needed for moderate pain.      Allergies  Allergen Reactions  . Statins   . Strawberry (Diagnostic)       The results of significant diagnostics from this hospitalization  (including imaging, microbiology, ancillary and laboratory) are listed below for reference.    Significant Diagnostic Studies: DG Chest 1 View  Result Date: 06/02/2020 CLINICAL DATA:  Shortness of breath. EXAM: CHEST  1 VIEW COMPARISON:  Radiograph and CT yesterday.  PET CT 3 days ago FINDINGS: Lower lung volumes from prior exam. Chronic volume loss in the right hemithorax. Pleuroparenchymal opacity at the right lung apex is well as diffuse irregular opacities throughout the right lung, progressive at the right lung base. Left basilar pulmonary pulmonary nodules on CT are not well-defined by radiograph. Chronic interstitial coarsening throughout the left hemithorax. No pneumothorax or significant pleural effusion.2 bony destructive changes involving the right first and second rib were better seen on prior CT. Surgical hardware in the left proximal humerus. IMPRESSION: 1. Progressive irregular right lung opacities particularly at the bases, favor atelectasis, infection could have a similar appearance. 2. Unchanged volume loss in the right hemithorax with pleuroparenchymal opacity at the apex corresponding to known malignancy. Underlying chronic interstitial lung changes. Many of the known pulmonary nodules are not well seen by radiograph. 3. Known osseous destructive changes about the right first and second ribs. Electronically Signed   By: Keith Rake M.D.   On: 06/02/2020 03:57   DG Chest 2 View  Result Date: 06/01/2020 CLINICAL DATA:  Shortness of breath. Known exposure to COVID-19 this week. EXAM: CHEST - 2 VIEW COMPARISON:  CT imaging from May 22, 2020 FINDINGS: The known right apical mass is appreciated. The known right middle lobe mass is best appreciated on the lateral view. Known interstitial lung disease is identified. Given the significant chronic changes in the lungs and the lack of a comparison chest x-ray, it would be difficult to exclude mild acute on chronic ground-glass infiltrates.  IMPRESSION: Given the significant chronic changes in the lungs and the lack of a comparison chest x-ray, it would be difficult to exclude mild acute on chronic ground-glass infiltrates. Known malignancy on the right as above. Known interstitial lung disease. Electronically Signed   By: Dorise Bullion III M.D   On: 06/01/2020 13:01   CT Angio Chest PE W and/or Wo Contrast  Result Date: 06/01/2020 CLINICAL DATA:  87 year old female with shortness of breath. Right apical mass. EXAM: CT ANGIOGRAPHY CHEST WITH CONTRAST TECHNIQUE: Multidetector CT imaging of the chest was performed using the standard protocol during bolus administration of intravenous contrast. Multiplanar CT image reconstructions and MIPs were obtained to evaluate the vascular anatomy. CONTRAST:  61mL  OMNIPAQUE IOHEXOL 350 MG/ML SOLN COMPARISON:  Chest CT dated 05/02/2020. FINDINGS: Cardiovascular: There is no cardiomegaly or pericardial effusion. Coronary vascular calcification. There is advanced atherosclerotic calcification the thoracic aorta. No aneurysmal dilatation or dissection. Evaluation of the pulmonary arteries is limited due to respiratory motion artifact. No large or central pulmonary artery embolus identified. Mediastinum/Nodes: Mildly enlarged right hilar lymph node measures 12 mm. There is a small hiatal hernia. No mediastinal fluid collection. Lungs/Pleura: Right apical consolidation/mass as seen previously. Multiple bilateral pulmonary nodules consistent with metastatic disease. Interval increase in the size of the nodular with prosthesis since the prior CT. There is an area of consolidation involving the right middle lobe progressed since the prior CT. There is increased interstitial prominence and diffuse ground-glass density throughout the upper lobes and right middle lobe compared to prior CT which may represent progression of lymphangitic carcinomatosis or pneumonia. Clinical correlation is recommended. There is no pleural  effusion pneumothorax. The central airways are patent. Upper Abdomen: Faint hepatic hypodense lesions most consistent with metastatic disease. Musculoskeletal: Erosive changes of the right first and second ribs secondary to right apical mass. Review of the MIP images confirms the above findings. IMPRESSION: 1. No CT evidence of central pulmonary artery embolus. 2. Right apical consolidation/mass as seen previously. Interval increase in the size of the metastatic disease compared to prior CT. 3. Interval increase in the interstitial prominence and diffuse ground-glass density throughout the upper lobes and right middle lobe compared to the prior CT which may represent progression of lymphangitic carcinomatosis or pneumonia. 4. Mildly enlarged right hilar lymph node. 5. Hepatic metastatic disease. 6. Aortic Atherosclerosis (ICD10-I70.0). Electronically Signed   By: Anner Crete M.D.   On: 06/01/2020 21:56   NM PET Image Initial (PI) Skull Base To Thigh  Result Date: 05/31/2020 CLINICAL DATA:  Subsequent treatment strategy for non-small cell lung cancer. EXAM: NUCLEAR MEDICINE PET SKULL BASE TO THIGH TECHNIQUE: 7.5 mCi F-18 FDG was injected intravenously. Full-ring PET imaging was performed from the skull base to thigh after the radiotracer. CT data was obtained and used for attenuation correction and anatomic localization. Fasting blood glucose: 117 mg/dl COMPARISON:  CT chest dated 05/02/2020 FINDINGS: Mediastinal blood pool activity: SUV max 2.0 Liver activity: SUV max NA NECK: 16 mm nodule in the right adrenal gland (series 3/image 54), max SUV 8.4. No hypermetabolic cervical lymphadenopathy. Incidental CT findings: none CHEST: 7.1 cm right apical chest wall mass involving the right anterior 1st and 2nd rib (series 3/image 58), max SUV 17.1. This corresponds to the patient's known primary bronchogenic neoplasm with chest wall involvement. 7.3 cm medial right middle lobe mass (series 3/image 92), max SUV  17.8. 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 Numerous widespread pulmonary metastases, including a 2.0 cm nodule in the left lower lobe (series 3/image 87), max SUV 7.2. No hypermetabolic mediastinal lymphadenopathy. Right axillary nodal metastases measuring up to 12 mm short axis (series 3/image 74), max SUV 7.7. Incidental CT findings: Atherosclerotic calcifications of the aortic arch/root. Coronary atherosclerosis of the LAD and left circumflex. ABDOMEN/PELVIS: Numerous hepatic metastases, max SUV 15.7 in the right hepatic dome and 10.2 in the lateral segment left hepatic lobe. Possible focal hypermetabolism in the medial spleen, max SUV 4.4, raising concern for a splenic metastasis. No focal hypermetabolism in the pancreas or adrenal glands. Upper abdominal/retroperitoneal lymphadenopathy, including a 14 mm short axis right para-aortic node (series 3/image 145) with max SUV 11.2 and a 13 mm short axis left para-aortic  node (series 3/image 152) with max SUV 7.6. Two peritoneal implants along the jejunal mesentery in the left mid abdomen measuring 2.9 and 3.3 cm (series 3/image 167), max SUV 13.9. Additional peritoneal implants, including a dominant 3.7 cm lesion in the left lower pelvis (series 3/image 202), max SUV 18.6. Small volume pelvic ascites. Incidental CT findings: Right lower pole renal cyst. Atherosclerotic calcifications abdominal aorta and branch vessels. Mild sigmoid diverticulosis. SKELETON: Multifocal osseous metastases, including: --Right proximal humerus, max SUV 3.9 --Left posterior 3rd rib, max SUV 4.6 --T7 vertebral body, max SUV 6.2 --T8 posterior spinal process, max SUV 11.0 --Posterior left iliac bone, max SUV 4.4 --Left femoral head, max SUV 14.0 --Right inferior pubic ramus, max SUV 14.3 Soft tissue metastases, including: --Right breast, max SUV 5.3 --Left anterior chest wall, max SUV 10.0 --Right gluteal region, max SUV 11.9 Incidental CT  findings: ORIF of the left shoulder/proximal humerus. Degenerative changes of the visualized thoracolumbar spine. IMPRESSION: Right apical mass with involvement of the chest wall, corresponding to the patient's known primary bronchogenic neoplasm. 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 Additional widespread multifocal pulmonary metastases. Numerous hepatic metastases. Possible splenic metastasis. Nodal and peritoneal metastases. Multifocal osseous and soft tissue metastases. Electronically Signed   By: Julian Hy M.D.   On: 05/31/2020 08:41   DG Chest Port 1 View  Result Date: 06/04/2020 CLINICAL DATA:  Shortness of breath EXAM: PORTABLE CHEST 1 VIEW COMPARISON:  06/02/2020 FINDINGS: Extensive bilateral airspace opacities, worsening slightly since prior study. Elevation of the right hemidiaphragm. Small right pleural effusion. Heart is normal size. Aortic atherosclerosis. IMPRESSION: Worsening diffuse bilateral airspace disease. Small right effusion. Electronically Signed   By: Rolm Baptise M.D.   On: 06/04/2020 23:57   ECHOCARDIOGRAM COMPLETE  Result Date: 06/02/2020    ECHOCARDIOGRAM REPORT   Patient Name:   Kim Newman Date of Exam: 06/02/2020 Medical Rec #:  [id number]       Height:       62.0 in Accession #:    [id number]      Weight:       147.0 lb Date of Birth:  1927-11-21        BSA:          1.677 m Patient Age:    64 years        BP:           134/63 mmHg Patient Gender: F               HR:           97 bpm. Exam Location:  [facility] Procedure: 2D Echo, Cardiac Doppler and Color Doppler Indications:     Abnormal ECG R94.31  History:         Patient has no prior history of Echocardiogram examinations.                  Risk Factors:Hypertension.  Sonographer:     Alyse Low Roar Referring Phys:  [id number] [place] Diagnosing Phys: Ida Rogue MD IMPRESSIONS  1. Left ventricular ejection fraction, by estimation, is 55 to 60%. The left ventricle has  normal function. The left ventricle has no regional wall motion abnormalities. Left ventricular diastolic parameters are consistent with Grade I diastolic dysfunction (impaired relaxation).  2. Right ventricular systolic function is normal. The right ventricular size is normal. There is normal pulmonary artery systolic pressure. The estimated right ventricular systolic pressure is 54.2 mmHg.  3. The mitral valve is  normal in structure. Mild to moderate mitral valve regurgitation. No evidence of mitral stenosis.  4. The aortic valve was not well visualized. FINDINGS  Left Ventricle: Left ventricular ejection fraction, by estimation, is 55 to 60%. The left ventricle has normal function. The left ventricle has no regional wall motion abnormalities. The left ventricular internal cavity size was normal in size. There is  no left ventricular hypertrophy. Left ventricular diastolic parameters are consistent with Grade I diastolic dysfunction (impaired relaxation). Right Ventricle: The right ventricular size is normal. No increase in right ventricular wall thickness. Right ventricular systolic function is normal. There is normal pulmonary artery systolic pressure. The tricuspid regurgitant velocity is 2.57 m/s, and  with an assumed right atrial pressure of 5 mmHg, the estimated right ventricular systolic pressure is 79.0 mmHg. Left Atrium: Left atrial size was normal in size. Right Atrium: Right atrial size was normal in size. Pericardium: There is no evidence of pericardial effusion. Mitral Valve: The mitral valve is normal in structure. Mild mitral annular calcification. Mild to moderate mitral valve regurgitation. No evidence of mitral valve stenosis. Tricuspid Valve: The tricuspid valve is normal in structure. Tricuspid valve regurgitation is mild . No evidence of tricuspid stenosis. Aortic Valve: The aortic valve was not well visualized. Aortic valve regurgitation is not visualized. No aortic stenosis is present.  Aortic valve peak gradient measures 5.3 mmHg. Pulmonic Valve: The pulmonic valve was normal in structure. Pulmonic valve regurgitation is not visualized. No evidence of pulmonic stenosis. Aorta: The aortic root is normal in size and structure. Venous: The inferior vena cava is normal in size with greater than 50% respiratory variability, suggesting right atrial pressure of 3 mmHg. IAS/Shunts: No atrial level shunt detected by color flow Doppler.  LEFT VENTRICLE PLAX 2D LVIDd:         4.30 cm  Diastology LVIDs:         3.00 cm  LV e' medial:    23.70 cm/s LV PW:         0.90 cm  LV E/e' medial:  4.4 LV IVS:        0.90 cm  LV e' lateral:   10.40 cm/s LVOT diam:     1.80 cm  LV E/e' lateral: 10.0 LVOT Area:     2.54 cm  RIGHT VENTRICLE RV Mid diam:    3.40 cm RV S prime:     17.20 cm/s LEFT ATRIUM           Index       RIGHT ATRIUM           Index LA diam:      3.90 cm 2.33 cm/m  RA Area:     12.70 cm LA Vol (A4C): 41.5 ml 24.74 ml/m RA Volume:   29.50 ml  17.59 ml/m  AORTIC VALVE                PULMONIC VALVE AV Area (Vmax): 1.74 cm    PV Vmax:        0.92 m/s AV Vmax:        115.00 cm/s PV Peak grad:   3.4 mmHg AV Peak Grad:   5.3 mmHg    RVOT Peak grad: 2 mmHg LVOT Vmax:      78.60 cm/s  AORTA Ao Root diam: 2.60 cm MITRAL VALVE                TRICUSPID VALVE MV Area (PHT): 4.10 cm     TR Peak  grad:   26.4 mmHg MV Decel Time: 185 msec     TR Vmax:        257.00 cm/s MV E velocity: 104.00 cm/s MV A velocity: 129.00 cm/s  SHUNTS MV E/A ratio:  0.81         Systemic Diam: 1.80 cm MV A Prime:    14.9 cm/s Ida Rogue MD Electronically signed by Ida Rogue MD Signature Date/Time: 06/02/2020/5:41:58 PM    Final     Microbiology: Recent Results (from the past 240 hour(s))  Blood culture (routine x 2)     Status: None   Collection Time: 06/01/20  7:02 PM   Specimen: BLOOD  Result Value Ref Range Status   Specimen Description BLOOD LEFT ANTECUBITAL  Final   Special Requests   Final    BOTTLES DRAWN  AEROBIC AND ANAEROBIC Blood Culture adequate volume   Culture   Final    NO GROWTH 5 DAYS Performed at [facility], [address] [place], [place] [address]    Report Status 06/06/2020 FINAL  Final  Blood culture (routine x 2)     Status: None   Collection Time: 06/01/20  8:09 PM   Specimen: BLOOD  Result Value Ref Range Status   Specimen Description BLOOD RIGHT ANTECUBITAL  Final   Special Requests   Final    BOTTLES DRAWN AEROBIC AND ANAEROBIC Blood Culture results may not be optimal due to an excessive volume of blood received in culture bottles   Culture   Final    NO GROWTH 5 DAYS Performed at [facility], [address] [place], [place] [address]    Report Status 06/06/2020 FINAL  Final  Resp Panel by RT-PCR (Flu A&B, Covid) Nasopharyngeal Swab     Status: None   Collection Time: 06/01/20  8:55 PM   Specimen: Nasopharyngeal Swab; Nasopharyngeal(NP) swabs in vial transport medium  Result Value Ref Range Status   SARS Coronavirus 2 by RT PCR NEGATIVE NEGATIVE Final    Comment: (NOTE) SARS-CoV-2 target nucleic acids are NOT DETECTED.  The SARS-CoV-2 RNA is generally detectable in upper respiratory specimens during the acute phase of infection. The lowest concentration of SARS-CoV-2 viral copies this assay can detect is 138 copies/mL. A negative result does not preclude SARS-Cov-2 infection and should not be used as the sole basis for treatment or other patient management decisions. A negative result may occur with  improper specimen collection/handling, submission of specimen other than nasopharyngeal swab, presence of viral mutation(s) within the areas targeted by this assay, and inadequate number of viral copies(<138 copies/mL). A negative result must be combined with clinical observations, patient history, and epidemiological information. The expected result is Negative.  Fact Sheet for Patients:  EntrepreneurPulse.com.au  Fact  Sheet for Healthcare Providers:  IncredibleEmployment.be  This test is no t yet approved or cleared by the [place] FDA and  has been authorized for detection and/or diagnosis of SARS-CoV-2 by FDA under an Emergency Use Authorization (EUA). This EUA will remain  in effect (meaning this test can be used) for the duration of the COVID-19 declaration under Section 564(b)(1) of the Act, 21 U.S.C.section 360bbb-3(b)(1), unless the authorization is terminated  or revoked sooner.       Influenza A by PCR NEGATIVE NEGATIVE Final   Influenza B by PCR NEGATIVE NEGATIVE Final    Comment: (NOTE) The Xpert Xpress SARS-CoV-2/FLU/RSV plus assay is intended as an aid in the diagnosis of influenza from Nasopharyngeal swab specimens and should  not be used as a sole basis for treatment. Nasal washings and aspirates are unacceptable for Xpert Xpress SARS-CoV-2/FLU/RSV testing.  Fact Sheet for Patients: [employer]  Fact Sheet for Healthcare Providers: [employer]redibleEmployment[employer]be  This test is not yet approved or cleared by the [place] FDA and has been authorized for detection and/or diagnosis of SARS-CoV-2 by FDA under an Emergency Use Authorization (EUA). This EUA will remain in effect (meaning this test can be used) for the duration of the COVID-19 declaration under Section 564(b)(1) of the Act, 21 U.S.C. section 360bbb-3(b)(1), unless the authorization is terminated or revoked.  Performed at [facility], [address], [place], [place] [address]   Urine Culture     Status: Abnormal   Collection Time: 06/02/20  3:45 PM   Specimen: Urine, Random  Result Value Ref Range Status   Specimen Description   Final    URINE, RANDOM Performed at [facility], [address] [place], [place] [address]    Special Requests   Final    NONE Performed at [facility], [place]  [place], [place] [address]    Culture (A)  Final    <10,000 COLONIES/mL INSIGNIFICANT GROWTH Performed at [facility], [place] [address] [place], [place] [address]    Report Status 06/04/2020 FINAL  Final     Labs: Basic Metabolic Panel: Recent Labs  Lab 06/01/20 1223 06/02/20 0320 06/03/20 0417 06/04/20 0622 06/05/20 0318 06/06/20 0204  NA 133* 133*  --  138 139 136  K 3.9 4.2  --  3.9 3.8 3.5  CL 97* 97*  --  95* 93* 89*  CO2 24 26  --  30 35* 37*  GLUCOSE 142* 144*  --  132* 150* 122*  BUN 19 15  --  16 14 14   CREATININE 0.75 0.63  --  0.56 0.49 0.47  CALCIUM 9.1 8.8*  --  9.1 8.7* 8.7*  MG  --   --  1.8  --  1.9  --    Liver Function Tests: Recent Labs  Lab 06/05/20 0318  AST 62*  ALT 55*  ALKPHOS 101  BILITOT 0.4  PROT 6.4*  ALBUMIN 2.5*   No results for input(s): LIPASE, AMYLASE in the last 168 hours. No results for input(s): AMMONIA in the last 168 hours. CBC: Recent Labs  Lab 06/02/20 0320 06/03/20 0417 06/04/20 0622 06/05/20 0318 06/06/20 0204  WBC 36.3* 26.9* 28.7* 26.5* 26.1*  NEUTROABS 20.9*  --   --   --   --   HGB 15.2* 13.6 14.9 15.1* 15.5*  HCT 47.9* 42.8 48.2* 47.6* 48.3*  MCV 89.0 89.0 90.6 89.8 89.6  PLT 213 191 215 201 194   Cardiac Enzymes: No results for input(s): CKTOTAL, CKMB, CKMBINDEX, TROPONINI in the last 168 hours. BNP: BNP (last 3 results) Recent Labs    06/05/20 0318  BNP 1,013.8*    ProBNP (last 3 results) No results for input(s): PROBNP in the last 8760 hours.  CBG: No results for input(s): GLUCAP in the last 168 hours.     Signed:  Annita Brod, MD [facility] 06/07/2020, 1:48 PM

## 2020-06-07 NOTE — Progress Notes (Addendum)
Daily Progress Note   Patient Name: Kim Newman       Date: 06/07/2020 DOB: [birth year]  Age: 87 y.o. MRN#: [id number] Attending Physician: Annita Brod, MD Primary Care Physician: Baxter Hire, MD Admit Date: 06/01/2020  Reason for Consultation/Follow-up: Establishing goals of care  Subjective: Patient awake, alert, oriented. In good spirits this morning. Appears more comfortable compared to when this NP saw her on Wednesday. Remains on 12L [place]. No dyspnea or pain.  GOC:  Daughter Kim Newman and granddaughter Kim Newman at bedside. Introduced role of palliative medicine again. Reviewed course of hospitalization including diagnoses, interventions, plan of care. Patient/family appreciative of visit from oncology yesterday. They understand severity of her cancer and confirm plan to forgo aggressive work-up including biopsy and/or chemotherapy. Daughter shares her main goals are for her mother to be comfortable and pain free. Patient shares her desire to go home if possible and spend time with her family for remainder of her time. Patient/family seem at peace with diagnosis and poor prognosis. Granddaughter shares that they very much put their lives in [place] hands and take things head on. They have had time to process the severity of her cancer since December.   Discussed hospice options and philosophy including focus on comfort, symptom management and relief from suffering, quality of life, and preventing recurrent hospitalization. Patient's daughter and granddaughter are very supportive and able to care for her at home. We discussed transfer to hospice home in the future if symptom burden worsens. Discussed symptom management medications.   Patient will need medical equipment--hospital bed,  BSC, oxygen. Explained TOC involvement and hospice referral.  Discussed medical optimization inpatient (continue antibiotics and lasix) with plan for home hospice when family is ready. They need to prepare the house.   Answered questions and concerns. Daughter has PMT contact information and understands she can call this afternoon if needs.   Updated RN, Dr. Maryland Pink, and RN CM.    Length of Stay: 6  Current Medications: Scheduled Meds:  . aspirin EC  81 mg Oral BID  . fluticasone  1 spray Each Nare BID   And  . azelastine  1 spray Each Nare BID  . enoxaparin (LOVENOX) injection  40 mg Subcutaneous Q24H  . ferrous sulfate  325 mg Oral BID WC  . furosemide  40  mg Intravenous BID  . ipratropium-albuterol  3 mL Nebulization BID  . loratadine  10 mg Oral Daily  . losartan  50 mg Oral Daily  . metoprolol tartrate  25 mg Oral BID  .  morphine injection  1 mg Intravenous Once  . multivitamin with minerals  1 tablet Oral Daily  . pantoprazole  40 mg Oral Daily  . vitamin B-12  1,000 mcg Oral BID    Continuous Infusions:   PRN Meds: albuterol, haloperidol lactate  Physical Exam Vitals and nursing note reviewed.  Constitutional:      General: She is awake.     Appearance: She is ill-appearing.  HENT:     Head: Normocephalic and atraumatic.  Pulmonary:     Effort: No tachypnea, accessory muscle usage or respiratory distress.     Comments: 12L [place] Abdominal:     Tenderness: There is no abdominal tenderness.  Skin:    General: Skin is warm and dry.  Neurological:     Mental Status: She is alert and oriented to person, place, and time.  Psychiatric:        Mood and Affect: Mood normal.        Speech: Speech normal.        Behavior: Behavior normal.        Cognition and Memory: Cognition normal.             Vital Signs: BP (!) 106/59 (BP Location: Left Arm)   Pulse (!) 109   Temp 97.7 F (36.5 C)   Resp 16   Ht 5\' 2"  (1.575 m)   Wt 61.5 kg   SpO2 91%   BMI 24.80  kg/m  SpO2: SpO2: 91 % O2 Device: O2 Device: Nasal Cannula O2 Flow Rate: O2 Flow Rate (L/min): 12 L/min  Intake/output summary:   Intake/Output Summary (Last 24 hours) at 06/07/2020 0830 Last data filed at 06/07/2020 0428 Gross per 24 hour  Intake 120 ml  Output 1000 ml  Net -880 ml   LBM: Last BM Date: 06/04/20 Baseline Weight: Weight: 66.7 kg Most recent weight: Weight: 61.5 kg       Palliative Assessment/Data: PPS 40%      Patient Active Problem List   Diagnosis Date Noted  . Metastatic malignant neoplasm ([place])   . Liver metastasis ([place])   . Lung mass   . Goals of care, counseling/discussion   . Pressure injury of skin 06/05/2020  . Hypertension   . Acute respiratory failure with hypoxia and hypercapnia ([facility])   . Metastatic primary lung cancer, right ([place]) 06/01/2020  . Pneumonia 06/01/2020  . Iron deficiency anemia due to chronic blood loss 04/01/2016  . Symptomatic anemia 03/17/2016    Palliative Care Assessment & Plan   Patient Profile: 87 y.o. female  with past medical history of smoking, hypertension, obstructive sleep apnea, lung nodules (incidential finding 4 years ago), and recent finding lung mass/suspected lung cancer admitted on 06/01/2020 with shortness of breath, cough, and weakness. In ED patient hypoxic requiring oxygen supplementation. CT chest concerning for right upper lobe mass and increased ground glass opacities suggesting lymphangitic carcinomatosis versus pneumonia. Hospital admission for acute hypoxemic respiratory failure started on antibiotics for community-acquired pneumonia. On 1/12 early AM, rapid response called for tachycardia, hypoxia on 12L, and worsening respiratory status. The patient is requiring 12L [facility]NC this afternoon.   Of note, patient was evaluated by Dr. Donella Stade on 05/09/20 with concern for stage IV non-small cell lung cancer. PET scan ordered to complete  staging and determine extent of disease and targets for palliative  radiation. Plan was to see medical oncology as well. PET scan 05/30/20 reveals right apical mass with involvement of chest wall, corresponding with primary bronchogenic neoplasm. 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   Assessment: Acute respiratory failure with hypoxia and hypercapnia Widespread metastatic disease, suspicion for primary lung cancer Acute on chronic diastolic heart failure Dyspnea  Recommendations/Plan:  Appreciate oncology recommendations. Patient/family confirm plan to forgo oncology workup including biopsy and chemotherapy, understanding poor prognosis. Patient/family share that comfort and quality of life are important moving forward. Patient wishes to return home and spend time with family.   Medical optimization inpatient with discharge plan home with hospice. TOC team notified. Will need DME. Try to get her home on 10L. Treat dyspnea with prn Roxanol.  RN to attempt ambulation to [facility] this afternoon.   Symptom management  Roxanol 5-10mg  SL q3h prn pain/dyspnea/air hunger/tachypnea  Lidocaine patch right shoulder  Xanax 0.25mg  PO TID prn anxiety/sleep  Senna daily prn   Comfort feeds per patient/family request.   Code Status: DNR   Code Status Orders  (From admission, onward)         Start     Ordered   06/01/20 2348  Do not attempt resuscitation (DNR)  Continuous       Question Answer Comment  In the event of cardiac or respiratory ARREST Do not call a "code blue"   In the event of cardiac or respiratory ARREST Do not perform Intubation, CPR, defibrillation or ACLS   In the event of cardiac or respiratory ARREST Use medication by any route, position, wound care, and other measures to relive pain and suffering. May use oxygen, suction and manual treatment of  airway obstruction as needed for comfort.      06/01/20 2347        Code Status History    Date Active Date Inactive Code Status Order ID Comments User Context   06/01/2020 2242 06/01/2020 2347 Full Code [id number]  Artist Beach, MD ED   03/17/2016 1654 03/18/2016 1929 Full Code [id number]  Hower, Aaron Mose, MD ED   Advance Care Planning Activity    Advance Directive Documentation   Flowsheet Row Most Recent Value  Type of Advance Directive Living will  Pre-existing out of facility DNR order (yellow form or pink MOST form) -  "MOST" Form in Place? -       Prognosis:  Poor long-term prognosis with acute respiratory failure secondary to pneumonia, suspected metastatic lung cancer, and diastolic heart failure. Widespread metastatic disease on PET scan.   Discharge Planning:  Home with hospice  Care plan was discussed with RN, patient, daughter, Dr. Maryland Pink, RN CM, hospice liaison  Thank you for allowing the Palliative Medicine Team to assist in the care of this patient.   Total Time 45 Prolonged Time Billed  no      Greater than 50%  of this time was spent counseling and coordinating care related to the above assessment and plan.  Ihor Dow, DNP, FNP-C Palliative Medicine Team  Phone: [phone] Fax: [phone]  Please contact Palliative Medicine Team phone at [phone] for questions and concerns.

## 2020-06-08 DIAGNOSIS — J9601 Acute respiratory failure with hypoxia: Secondary | ICD-10-CM | POA: Diagnosis not present

## 2020-06-08 DIAGNOSIS — J9602 Acute respiratory failure with hypercapnia: Secondary | ICD-10-CM | POA: Diagnosis not present

## 2020-06-08 NOTE — Discharge Summary (Signed)
Discharge Summary  INDICA MARCOTT QJJ:[id number] DOB: Jul 11, 1927  PCP: Baxter Hire, MD  Admit date: 06/01/2020 Discharge date: 06/08/2020  Time spent: 25 minutes   Recommendations for Outpatient Follow-up:  1. Patient going home with hospice. 2. New medication: Lidocaine patch on for 12 hours, off for 12 hours applied to right shoulder. 3. New medication: Lasix 20 mg p.o. daily 4. New medication: Xanax 0.5 mg p.o. 3 times daily as needed 5. New medication: Roxanol 5 to 10 mg under tongue 3 times daily as needed for work of breathing 6. Patient will discontinue the following medications: Aspirin, calcium carbonate, iron, fish oil, multivitamin   Discharge Diagnoses:  Active Hospital Problems   Diagnosis Date Noted  . Acute respiratory failure with hypoxia and hypercapnia (HCC)   . Metastatic malignant neoplasm ([place])   . Liver metastasis ([place])   . Lung mass   . Goals of care, counseling/discussion   . Pressure injury of skin 06/05/2020  . Hypertension   . Acute on chronic diastolic CHF (congestive heart failure) ([place]) 06/01/2020  . Symptomatic anemia 03/17/2016    Resolved Hospital Problems  No resolved problems to display.    Discharge Condition: Guarded  Diet recommendation: Pleasure feedings  Vitals:   06/08/20 0900 06/08/20 1322  BP: (!) 143/67 (!) 112/48  Pulse: 94 85  Resp: (!) 22 20  Temp: 97.8 F (36.6 C) 98 F (36.7 C)  SpO2: 95% 95%    History of present illness:  87 year old female with past medical history of hypertension, obstructive sleep apnea and had a recent diagnosis of lung cancer in mid December who had been in her usual state of health, not on oxygen, started having progressively worsening shortness of breath over the past week and then came into the emergency room on 1/8 for further evaluation. COVID-negative. Emergency room, medically markedly hypoxic I &D white count elevated at 36,000 CT scan of the chest concerning for right upper  lobe mass with increased groundglass opacities suggestive of lymphangitic carcinomatosis versus pneumonia. Procalcitonin minimal at 0.15 with a normal lactic acid level on admission. Patient started on Rocephin and Zithromax and placed on 5 L of oxygen. Also noted to have an elevated troponin mild. She was seen by cardiology. Felt to be demand ischemia.  On 1/11, oxygen needs have slowly increased up to 10 L high flow and on night of 1/11, patient noted to be in respiratory distress requiring 12 L nasal cannula. Also found to have bibasilar crackles given 40 mg of IV Lasix plus 1 mg of morphine which did help her breathing. Also found to be in acute diastolic CHFand started on IV Lasix. Patient has diuresed over 3 L of fluid since.  After meeting with palliative care and oncology and realizing the poor prognosis or cancer diagnosis had, patient and her family opted to go home with hospice.  She will continue on Lasix as well as other medications for comfort.  Hospice contacted and hospital bed and other equipment should be delivered and ready by 1/15, which is when patient will be discharged.  06/08/20:  Seen and examined.  Appears in no acute distress.  No new complaints.  Hospital Course:  Principal Problem:   Acute respiratory failure with hypoxia and hypercapnia (HCC) Active Problems:   Symptomatic anemia   Acute on chronic diastolic CHF (congestive heart failure) (HCC)   Pressure injury of skin   Hypertension   Metastatic malignant neoplasm ([place])   Liver metastasis (HCC)   Lung mass  Goals of care, counseling/discussion  Acute respiratory failure with hypoxia and hypercapnia (HCC) secondary to extensive lung cancer plus acute on chronic diastolic heart failure:Less likely that this is any type of infectious process given normal procalcitonin level by following day. White count more elevated due to stress margination. BNP checked on 1/12 and found to be markedly elevated at 1013.  Have started IV Lasix which should hopefully ease her breathing slightly. She has diuresed 2 L to date.. Echocardiogram done on 1/9 noted diastolic dysfunction.  With normal procalcitonin, antibiotics discontinued.  Pressure injury of skin: Pressure Injury 06/03/20 Sacrum Medial Stage 2 -  Partial thickness loss of dermis presenting as a shallow open injury with a red, pink wound bed without slough. (Active)  06/03/20 0900  Location: Sacrum  Location Orientation: Medial  Staging: Stage 2 -  Partial thickness loss of dermis presenting as a shallow open injury with a red, pink wound bed without slough.  Wound Description (Comments):   Present on Admission: Yes   Hypertension: Blood pressure stable   Consultants:  Palliative care  Oncology  Procedures:  Echocardiogram done 4/16: Grade 1 diastolic dysfunction. Preserved ejection fraction. Mild to moderate mitral regurg   Discharge Exam: BP (!) 112/48 (BP Location: Left Arm)   Pulse 85   Temp 98 F (36.7 C) (Oral)   Resp 20   Ht 5\' 2"  (1.575 m)   Wt 61.5 kg   SpO2 95%   BMI 24.80 kg/m  . General: 87 y.o. year-old female Chronically ill appearing in no acute distress.  Alert and oriented x2. . Cardiovascular: Regular rate and rhythm with no rubs or gallops.  No thyromegaly or JVD noted.   Marland Kitchen Respiratory: Decreased breath sounds . Abdomen: Soft nontender nondistended with normal bowel sounds. . Psychiatry: Mood is appropriate for condition and setting  Discharge Instructions You were cared for by a hospitalist during your hospital stay. If you have any questions about your discharge medications or the care you received while you were in the hospital after you are discharged, you can call the unit and asked to speak with the hospitalist on call if the hospitalist that took care of you is not available. Once you are discharged, your primary care physician will handle any further medical issues. Please note that NO REFILLS  for any discharge medications will be authorized once you are discharged, as it is imperative that you return to your primary care physician (or establish a relationship with a primary care physician if you do not have one) for your aftercare needs so that they can reassess your need for medications and monitor your lab values.   Allergies as of 06/08/2020      Reactions   Statins    Strawberry (diagnostic)       Medication List    STOP taking these medications   aspirin EC 81 MG tablet   calcium carbonate 1250 (500 Ca) MG tablet Commonly known as: OS-CAL - dosed in mg of elemental calcium   cetirizine 10 MG tablet Commonly known as: ZYRTEC   cholecalciferol 25 MCG (1000 UNIT) tablet Commonly known as: VITAMIN D3   Dymista 137-50 MCG/ACT Susp Generic drug: Azelastine-Fluticasone   ferrous sulfate 325 (65 FE) MG tablet   Fish Oil 1000 MG Caps   PRESERVISION AREDS 2 PO   vitamin B-12 1000 MCG tablet Commonly known as: CYANOCOBALAMIN     TAKE these medications   Advair Diskus 100-50 MCG/DOSE Aepb Generic drug: Fluticasone-Salmeterol Inhale 2 puffs into  the lungs 2 (two) times daily.   albuterol 108 (90 Base) MCG/ACT inhaler Commonly known as: VENTOLIN HFA Inhale 2 puffs into the lungs 4 (four) times daily as needed for wheezing or shortness of breath.   ALPRAZolam 0.25 MG tablet Commonly known as: XANAX Take 1 tablet (0.25 mg total) by mouth 3 (three) times daily as needed for anxiety or sleep.   Denta 5000 Plus 1.1 % Crea dental cream Generic drug: sodium fluoride Take 1 application by mouth daily.   furosemide 20 MG tablet Commonly known as: Lasix Take 1 tablet (20 mg total) by mouth daily.   lidocaine 5 % Commonly known as: LIDODERM Place 1 patch onto the skin daily. Place on R shoulder.  Remove & Discard patch within 12 hours or as directed by MD   morphine CONCENTRATE 10 MG/0.5ML Soln concentrated solution Place 0.25-0.5 mLs (5-10 mg total) under the  tongue every 3 (three) hours as needed for moderate pain (dyspnea/air hunger/tachypnea).   omeprazole 20 MG capsule Commonly known as: PRILOSEC Take 1 capsule (20 mg total) by mouth daily. What changed: how much to take   telmisartan 40 MG tablet Commonly known as: MICARDIS Take 40 mg by mouth daily.   traMADol 50 MG tablet Commonly known as: ULTRAM Take 50 mg by mouth every 12 (twelve) hours as needed for moderate pain.      Allergies  Allergen Reactions  . Statins   . Strawberry (Diagnostic)       The results of significant diagnostics from this hospitalization (including imaging, microbiology, ancillary and laboratory) are listed below for reference.    Significant Diagnostic Studies: DG Chest 1 View  Result Date: 06/02/2020 CLINICAL DATA:  Shortness of breath. EXAM: CHEST  1 VIEW COMPARISON:  Radiograph and CT yesterday.  PET CT 3 days ago FINDINGS: Lower lung volumes from prior exam. Chronic volume loss in the right hemithorax. Pleuroparenchymal opacity at the right lung apex is well as diffuse irregular opacities throughout the right lung, progressive at the right lung base. Left basilar pulmonary pulmonary nodules on CT are not well-defined by radiograph. Chronic interstitial coarsening throughout the left hemithorax. No pneumothorax or significant pleural effusion.2 bony destructive changes involving the right first and second rib were better seen on prior CT. Surgical hardware in the left proximal humerus. IMPRESSION: 1. Progressive irregular right lung opacities particularly at the bases, favor atelectasis, infection could have a similar appearance. 2. Unchanged volume loss in the right hemithorax with pleuroparenchymal opacity at the apex corresponding to known malignancy. Underlying chronic interstitial lung changes. Many of the known pulmonary nodules are not well seen by radiograph. 3. Known osseous destructive changes about the right first and second ribs. Electronically  Signed   By: Keith Rake M.D.   On: 06/02/2020 03:57   DG Chest 2 View  Result Date: 06/01/2020 CLINICAL DATA:  Shortness of breath. Known exposure to COVID-19 this week. EXAM: CHEST - 2 VIEW COMPARISON:  CT imaging from May 22, 2020 FINDINGS: The known right apical mass is appreciated. The known right middle lobe mass is best appreciated on the lateral view. Known interstitial lung disease is identified. Given the significant chronic changes in the lungs and the lack of a comparison chest x-ray, it would be difficult to exclude mild acute on chronic ground-glass infiltrates. IMPRESSION: Given the significant chronic changes in the lungs and the lack of a comparison chest x-ray, it would be difficult to exclude mild acute on chronic ground-glass infiltrates. Known malignancy on the  right as above. Known interstitial lung disease. Electronically Signed   By: Dorise Bullion III M.D   On: 06/01/2020 13:01   CT Angio Chest PE W and/or Wo Contrast  Result Date: 06/01/2020 CLINICAL DATA:  87 year old female with shortness of breath. Right apical mass. EXAM: CT ANGIOGRAPHY CHEST WITH CONTRAST TECHNIQUE: Multidetector CT imaging of the chest was performed using the standard protocol during bolus administration of intravenous contrast. Multiplanar CT image reconstructions and MIPs were obtained to evaluate the vascular anatomy. CONTRAST:  31mL OMNIPAQUE IOHEXOL 350 MG/ML SOLN COMPARISON:  Chest CT dated 05/02/2020. FINDINGS: Cardiovascular: There is no cardiomegaly or pericardial effusion. Coronary vascular calcification. There is advanced atherosclerotic calcification the thoracic aorta. No aneurysmal dilatation or dissection. Evaluation of the pulmonary arteries is limited due to respiratory motion artifact. No large or central pulmonary artery embolus identified. Mediastinum/Nodes: Mildly enlarged right hilar lymph node measures 12 mm. There is a small hiatal hernia. No mediastinal fluid collection.  Lungs/Pleura: Right apical consolidation/mass as seen previously. Multiple bilateral pulmonary nodules consistent with metastatic disease. Interval increase in the size of the nodular with prosthesis since the prior CT. There is an area of consolidation involving the right middle lobe progressed since the prior CT. There is increased interstitial prominence and diffuse ground-glass density throughout the upper lobes and right middle lobe compared to prior CT which may represent progression of lymphangitic carcinomatosis or pneumonia. Clinical correlation is recommended. There is no pleural effusion pneumothorax. The central airways are patent. Upper Abdomen: Faint hepatic hypodense lesions most consistent with metastatic disease. Musculoskeletal: Erosive changes of the right first and second ribs secondary to right apical mass. Review of the MIP images confirms the above findings. IMPRESSION: 1. No CT evidence of central pulmonary artery embolus. 2. Right apical consolidation/mass as seen previously. Interval increase in the size of the metastatic disease compared to prior CT. 3. Interval increase in the interstitial prominence and diffuse ground-glass density throughout the upper lobes and right middle lobe compared to the prior CT which may represent progression of lymphangitic carcinomatosis or pneumonia. 4. Mildly enlarged right hilar lymph node. 5. Hepatic metastatic disease. 6. Aortic Atherosclerosis (ICD10-I70.0). Electronically Signed   By: Anner Crete M.D.   On: 06/01/2020 21:56   NM PET Image Initial (PI) Skull Base To Thigh  Result Date: 05/31/2020 CLINICAL DATA:  Subsequent treatment strategy for non-small cell lung cancer. EXAM: NUCLEAR MEDICINE PET SKULL BASE TO THIGH TECHNIQUE: 7.5 mCi F-18 FDG was injected intravenously. Full-ring PET imaging was performed from the skull base to thigh after the radiotracer. CT data was obtained and used for attenuation correction and anatomic localization.  Fasting blood glucose: 117 mg/dl COMPARISON:  CT chest dated 05/02/2020 FINDINGS: Mediastinal blood pool activity: SUV max 2.0 Liver activity: SUV max NA NECK: 16 mm nodule in the right adrenal gland (series 3/image 54), max SUV 8.4. No hypermetabolic cervical lymphadenopathy. Incidental CT findings: none CHEST: 7.1 cm right apical chest wall mass involving the right anterior 1st and 2nd rib (series 3/image 58), max SUV 17.1. This corresponds to the patient's known primary bronchogenic neoplasm with chest wall involvement. 7.3 cm medial right middle lobe mass (series 3/image 92), max SUV 17.8. 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 Numerous widespread pulmonary metastases, including a 2.0 cm nodule in the left lower lobe (series 3/image 87), max SUV 7.2. No hypermetabolic mediastinal lymphadenopathy. Right axillary nodal metastases measuring up to 12 mm short axis (series 3/image 74), max SUV 7.7. Incidental  CT findings: Atherosclerotic calcifications of the aortic arch/root. Coronary atherosclerosis of the LAD and left circumflex. ABDOMEN/PELVIS: Numerous hepatic metastases, max SUV 15.7 in the right hepatic dome and 10.2 in the lateral segment left hepatic lobe. Possible focal hypermetabolism in the medial spleen, max SUV 4.4, raising concern for a splenic metastasis. No focal hypermetabolism in the pancreas or adrenal glands. Upper abdominal/retroperitoneal lymphadenopathy, including a 14 mm short axis right para-aortic node (series 3/image 145) with max SUV 11.2 and a 13 mm short axis left para-aortic node (series 3/image 152) with max SUV 7.6. Two peritoneal implants along the jejunal mesentery in the left mid abdomen measuring 2.9 and 3.3 cm (series 3/image 167), max SUV 13.9. Additional peritoneal implants, including a dominant 3.7 cm lesion in the left lower pelvis (series 3/image 202), max SUV 18.6. Small volume pelvic ascites. Incidental CT findings: Right lower  pole renal cyst. Atherosclerotic calcifications abdominal aorta and branch vessels. Mild sigmoid diverticulosis. SKELETON: Multifocal osseous metastases, including: --Right proximal humerus, max SUV 3.9 --Left posterior 3rd rib, max SUV 4.6 --T7 vertebral body, max SUV 6.2 --T8 posterior spinal process, max SUV 11.0 --Posterior left iliac bone, max SUV 4.4 --Left femoral head, max SUV 14.0 --Right inferior pubic ramus, max SUV 14.3 Soft tissue metastases, including: --Right breast, max SUV 5.3 --Left anterior chest wall, max SUV 10.0 --Right gluteal region, max SUV 11.9 Incidental CT findings: ORIF of the left shoulder/proximal humerus. Degenerative changes of the visualized thoracolumbar spine. IMPRESSION: Right apical mass with involvement of the chest wall, corresponding to the patient's known primary bronchogenic neoplasm. 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 Additional widespread multifocal pulmonary metastases. Numerous hepatic metastases. Possible splenic metastasis. Nodal and peritoneal metastases. Multifocal osseous and soft tissue metastases. Electronically Signed   By: Julian Hy M.D.   On: 05/31/2020 08:41   DG Chest Port 1 View  Result Date: 06/04/2020 CLINICAL DATA:  Shortness of breath EXAM: PORTABLE CHEST 1 VIEW COMPARISON:  06/02/2020 FINDINGS: Extensive bilateral airspace opacities, worsening slightly since prior study. Elevation of the right hemidiaphragm. Small right pleural effusion. Heart is normal size. Aortic atherosclerosis. IMPRESSION: Worsening diffuse bilateral airspace disease. Small right effusion. Electronically Signed   By: Rolm Baptise M.D.   On: 06/04/2020 23:57   ECHOCARDIOGRAM COMPLETE  Result Date: 06/02/2020    ECHOCARDIOGRAM REPORT   Patient Name:   SARYA LINENBERGER Date of Exam: 06/02/2020 Medical Rec #:  [id number]       Height:       62.0 in Accession #:    [id number]      Weight:       147.0 lb Date of Birth:  April 04, 1928         BSA:          1.677 m Patient Age:    4 years        BP:           134/63 mmHg Patient Gender: F               HR:           97 bpm. Exam Location:  [facility] Procedure: 2D Echo, Cardiac Doppler and Color Doppler Indications:     Abnormal ECG R94.31  History:         Patient has no prior history of Echocardiogram examinations.                  Risk Factors:Hypertension.  Sonographer:     Engineer, maintenance  Referring Phys:  [id number] [place] Diagnosing Phys: Ida Rogue MD IMPRESSIONS  1. Left ventricular ejection fraction, by estimation, is 55 to 60%. The left ventricle has normal function. The left ventricle has no regional wall motion abnormalities. Left ventricular diastolic parameters are consistent with Grade I diastolic dysfunction (impaired relaxation).  2. Right ventricular systolic function is normal. The right ventricular size is normal. There is normal pulmonary artery systolic pressure. The estimated right ventricular systolic pressure is 45.4 mmHg.  3. The mitral valve is normal in structure. Mild to moderate mitral valve regurgitation. No evidence of mitral stenosis.  4. The aortic valve was not well visualized. FINDINGS  Left Ventricle: Left ventricular ejection fraction, by estimation, is 55 to 60%. The left ventricle has normal function. The left ventricle has no regional wall motion abnormalities. The left ventricular internal cavity size was normal in size. There is  no left ventricular hypertrophy. Left ventricular diastolic parameters are consistent with Grade I diastolic dysfunction (impaired relaxation). Right Ventricle: The right ventricular size is normal. No increase in right ventricular wall thickness. Right ventricular systolic function is normal. There is normal pulmonary artery systolic pressure. The tricuspid regurgitant velocity is 2.57 m/s, and  with an assumed right atrial pressure of 5 mmHg, the estimated right ventricular systolic pressure is 09.8 mmHg. Left Atrium: Left  atrial size was normal in size. Right Atrium: Right atrial size was normal in size. Pericardium: There is no evidence of pericardial effusion. Mitral Valve: The mitral valve is normal in structure. Mild mitral annular calcification. Mild to moderate mitral valve regurgitation. No evidence of mitral valve stenosis. Tricuspid Valve: The tricuspid valve is normal in structure. Tricuspid valve regurgitation is mild . No evidence of tricuspid stenosis. Aortic Valve: The aortic valve was not well visualized. Aortic valve regurgitation is not visualized. No aortic stenosis is present. Aortic valve peak gradient measures 5.3 mmHg. Pulmonic Valve: The pulmonic valve was normal in structure. Pulmonic valve regurgitation is not visualized. No evidence of pulmonic stenosis. Aorta: The aortic root is normal in size and structure. Venous: The inferior vena cava is normal in size with greater than 50% respiratory variability, suggesting right atrial pressure of 3 mmHg. IAS/Shunts: No atrial level shunt detected by color flow Doppler.  LEFT VENTRICLE PLAX 2D LVIDd:         4.30 cm  Diastology LVIDs:         3.00 cm  LV e' medial:    23.70 cm/s LV PW:         0.90 cm  LV E/e' medial:  4.4 LV IVS:        0.90 cm  LV e' lateral:   10.40 cm/s LVOT diam:     1.80 cm  LV E/e' lateral: 10.0 LVOT Area:     2.54 cm  RIGHT VENTRICLE RV Mid diam:    3.40 cm RV S prime:     17.20 cm/s LEFT ATRIUM           Index       RIGHT ATRIUM           Index LA diam:      3.90 cm 2.33 cm/m  RA Area:     12.70 cm LA Vol (A4C): 41.5 ml 24.74 ml/m RA Volume:   29.50 ml  17.59 ml/m  AORTIC VALVE                PULMONIC VALVE AV Area (Vmax): 1.74 cm    PV  Vmax:        0.92 m/s AV Vmax:        115.00 cm/s PV Peak grad:   3.4 mmHg AV Peak Grad:   5.3 mmHg    RVOT Peak grad: 2 mmHg LVOT Vmax:      78.60 cm/s  AORTA Ao Root diam: 2.60 cm MITRAL VALVE                TRICUSPID VALVE MV Area (PHT): 4.10 cm     TR Peak grad:   26.4 mmHg MV Decel Time: 185 msec      TR Vmax:        257.00 cm/s MV E velocity: 104.00 cm/s MV A velocity: 129.00 cm/s  SHUNTS MV E/A ratio:  0.81         Systemic Diam: 1.80 cm MV A Prime:    14.9 cm/s Ida Rogue MD Electronically signed by Ida Rogue MD Signature Date/Time: 06/02/2020/5:41:58 PM    Final     Microbiology: Recent Results (from the past 240 hour(s))  Blood culture (routine x 2)     Status: None   Collection Time: 06/01/20  7:02 PM   Specimen: BLOOD  Result Value Ref Range Status   Specimen Description BLOOD LEFT ANTECUBITAL  Final   Special Requests   Final    BOTTLES DRAWN AEROBIC AND ANAEROBIC Blood Culture adequate volume   Culture   Final    NO GROWTH 5 DAYS Performed at [facility], [address] [place], [place] [address]    Report Status 06/06/2020 FINAL  Final  Blood culture (routine x 2)     Status: None   Collection Time: 06/01/20  8:09 PM   Specimen: BLOOD  Result Value Ref Range Status   Specimen Description BLOOD RIGHT ANTECUBITAL  Final   Special Requests   Final    BOTTLES DRAWN AEROBIC AND ANAEROBIC Blood Culture results may not be optimal due to an excessive volume of blood received in culture bottles   Culture   Final    NO GROWTH 5 DAYS Performed at [facility], [address] [place], [place] [address]    Report Status 06/06/2020 FINAL  Final  Resp Panel by RT-PCR (Flu A&B, Covid) Nasopharyngeal Swab     Status: None   Collection Time: 06/01/20  8:55 PM   Specimen: Nasopharyngeal Swab; Nasopharyngeal(NP) swabs in vial transport medium  Result Value Ref Range Status   SARS Coronavirus 2 by RT PCR NEGATIVE NEGATIVE Final    Comment: (NOTE) SARS-CoV-2 target nucleic acids are NOT DETECTED.  The SARS-CoV-2 RNA is generally detectable in upper respiratory specimens during the acute phase of infection. The lowest concentration of SARS-CoV-2 viral copies this assay can detect is 138 copies/mL. A negative result does not preclude SARS-Cov-2 infection  and should not be used as the sole basis for treatment or other patient management decisions. A negative result may occur with  improper specimen collection/handling, submission of specimen other than nasopharyngeal swab, presence of viral mutation(s) within the areas targeted by this assay, and inadequate number of viral copies(<138 copies/mL). A negative result must be combined with clinical observations, patient history, and epidemiological information. The expected result is Negative.  Fact Sheet for Patients:  EntrepreneurPulse.com.au  Fact Sheet for Healthcare Providers:  IncredibleEmployment.be  This test is no t yet approved or cleared by the [place] FDA and  has been authorized for detection and/or diagnosis of SARS-CoV-2 by FDA under an Emergency Use  Authorization (EUA). This EUA will remain  in effect (meaning this test can be used) for the duration of the COVID-19 declaration under Section 564(b)(1) of the Act, 21 U.S.C.section 360bbb-3(b)(1), unless the authorization is terminated  or revoked sooner.       Influenza A by PCR NEGATIVE NEGATIVE Final   Influenza B by PCR NEGATIVE NEGATIVE Final    Comment: (NOTE) The Xpert Xpress SARS-CoV-2/FLU/RSV plus assay is intended as an aid in the diagnosis of influenza from Nasopharyngeal swab specimens and should not be used as a sole basis for treatment. Nasal washings and aspirates are unacceptable for Xpert Xpress SARS-CoV-2/FLU/RSV testing.  Fact Sheet for Patients: EntrepreneurPulse.com.au  Fact Sheet for Healthcare Providers: IncredibleEmployment.be  This test is not yet approved or cleared by the [place] FDA and has been authorized for detection and/or diagnosis of SARS-CoV-2 by FDA under an Emergency Use Authorization (EUA). This EUA will remain in effect (meaning this test can be used) for the duration of the COVID-19 declaration  under Section 564(b)(1) of the Act, 21 U.S.C. section 360bbb-3(b)(1), unless the authorization is terminated or revoked.  Performed at [facility], [address] [place], [place] [address]   Urine Culture     Status: Abnormal   Collection Time: 06/02/20  3:45 PM   Specimen: Urine, Random  Result Value Ref Range Status   Specimen Description   Final    URINE, RANDOM Performed at [facility], [address] [place], [place] [address]    Special Requests   Final    NONE Performed at [facility], [address] [place], [place] [address]    Culture (A)  Final    <10,000 COLONIES/mL INSIGNIFICANT GROWTH Performed at [facility], [address], [place], [place] [address]    Report Status 06/04/2020 FINAL  Final     Labs: Basic Metabolic Panel: Recent Labs  Lab 06/02/20 0320 06/03/20 0417 06/04/20 0622 06/05/20 0318 06/06/20 0204  NA 133*  --  138 139 136  K 4.2  --  3.9 3.8 3.5  CL 97*  --  95* 93* 89*  CO2 26  --  30 35* 37*  GLUCOSE 144*  --  132* 150* 122*  BUN 15  --  16 14 14   CREATININE 0.63  --  0.56 0.49 0.47  CALCIUM 8.8*  --  9.1 8.7* 8.7*  MG  --  1.8  --  1.9  --    Liver Function Tests: Recent Labs  Lab 06/05/20 0318  AST 62*  ALT 55*  ALKPHOS 101  BILITOT 0.4  PROT 6.4*  ALBUMIN 2.5*   No results for input(s): LIPASE, AMYLASE in the last 168 hours. No results for input(s): AMMONIA in the last 168 hours. CBC: Recent Labs  Lab 06/02/20 0320 06/03/20 0417 06/04/20 0622 06/05/20 0318 06/06/20 0204  WBC 36.3* 26.9* 28.7* 26.5* 26.1*  NEUTROABS 20.9*  --   --   --   --   HGB 15.2* 13.6 14.9 15.1* 15.5*  HCT 47.9* 42.8 48.2* 47.6* 48.3*  MCV 89.0 89.0 90.6 89.8 89.6  PLT 213 191 215 201 194   Cardiac Enzymes: No results for input(s): CKTOTAL, CKMB, CKMBINDEX, TROPONINI in the last 168 hours. BNP: BNP (last 3 results) Recent Labs    06/05/20 0318  BNP 1,013.8*    ProBNP (last 3 results) No  results for input(s): PROBNP in the last 8760 hours.  CBG: No results for input(s): GLUCAP in the  last 168 hours.     Signed:  Kayleen Memos, MD [facility] 06/08/2020, 3:15 PM

## 2020-06-09 ENCOUNTER — Inpatient Hospital Stay: Payer: Medicare PPO

## 2020-06-09 DIAGNOSIS — J9601 Acute respiratory failure with hypoxia: Secondary | ICD-10-CM | POA: Diagnosis not present

## 2020-06-09 DIAGNOSIS — J9602 Acute respiratory failure with hypercapnia: Secondary | ICD-10-CM | POA: Diagnosis not present

## 2020-06-09 LAB — BASIC METABOLIC PANEL
Anion gap: 11 (ref 5–15)
BUN: 24 mg/dL — ABNORMAL HIGH (ref 8–23)
CO2: 40 mmol/L — ABNORMAL HIGH (ref 22–32)
Calcium: 8.6 mg/dL — ABNORMAL LOW (ref 8.9–10.3)
Chloride: 82 mmol/L — ABNORMAL LOW (ref 98–111)
Creatinine, Ser: 0.48 mg/dL (ref 0.44–1.00)
GFR, Estimated: >60 mL/min (ref >60)
Glucose, Bld: 119 mg/dL — ABNORMAL HIGH (ref 70–99)
Potassium: 3.7 mmol/L (ref 3.5–5.1)
Sodium: 133 mmol/L — ABNORMAL LOW (ref 135–145)

## 2020-06-09 LAB — BRAIN NATRIURETIC PEPTIDE: B Natriuretic Peptide: 147.2 pg/mL — ABNORMAL HIGH (ref 0.0–100.0)

## 2020-06-09 LAB — MAGNESIUM: Magnesium: 1.9 mg/dL (ref 1.7–2.4)

## 2020-06-09 NOTE — TOC Progression Note (Signed)
Transition of Care [facility]) - Progression Note    Patient Details  Name: Kim Newman MRN: [id number] Date of Birth: 11/04/1927  Transition of Care [facility]) CM/SW Contact  Boris Sharper, LCSW Phone Number: 06/09/2020, 11:37 AM  Clinical Narrative:    CSW was notified by pt's daughter Tye [place] that DME will be delivered on 1/17.   Expected Discharge Plan: Home w Hospice Care Barriers to Discharge: Equipment Delay  Expected Discharge Plan and Services Expected Discharge Plan: [place] arrangements for the past 2 months: Single Family Home                                       Social Determinants of Health (SDOH) Interventions    Readmission Risk Interventions No flowsheet data found.

## 2020-06-09 NOTE — Progress Notes (Addendum)
Discharge Summary  Kim Newman GHW:[id number] DOB: [birth year]  PCP: Baxter Hire, MD  Admit date: 06/01/2020 Discharge date: 06/09/2020  Time spent: 25 minutes   Recommendations for Outpatient Follow-up:  1. Patient going home with hospice. 2. New medication: Lidocaine patch on for 12 hours, off for 12 hours applied to right shoulder. 3. New medication: Lasix 20 mg p.o. daily 4. New medication: Xanax 0.5 mg p.o. 3 times daily as needed 5. New medication: Roxanol 5 to 10 mg under tongue 3 times daily as needed for work of breathing 6. Patient will discontinue the following medications: Aspirin, calcium carbonate, iron, fish oil, multivitamin   Discharge Diagnoses:  Active Hospital Problems   Diagnosis Date Noted  . Acute respiratory failure with hypoxia and hypercapnia (HCC)   . Metastatic malignant neoplasm ([place])   . Liver metastasis ([place])   . Lung mass   . Goals of care, counseling/discussion   . Pressure injury of skin 06/05/2020  . Hypertension   . Acute on chronic diastolic CHF (congestive heart failure) ([place]) 06/01/2020  . Symptomatic anemia 03/17/2016    Resolved Hospital Problems  No resolved problems to display.    Discharge Condition: Guarded  Diet recommendation: Pleasure feedings  Vitals:   06/09/20 0458 06/09/20 0852  BP: (!) 143/55 (!) 117/52  Pulse: 92 87  Resp: 17 19  Temp: 98.1 F (36.7 C) 97.8 F (36.6 C)  SpO2: 96% 92%    History of present illness:  87 year old female with past medical history of hypertension, obstructive sleep apnea and had a recent diagnosis of lung cancer in mid December who had been in her usual state of health, not on oxygen, started having progressively worsening shortness of breath over the past week and then came into the emergency room on 1/8 for further evaluation. COVID-negative. Emergency room, medically markedly hypoxic I &D white count elevated at 36,000 CT scan of the chest concerning for right upper lobe  mass with increased groundglass opacities suggestive of lymphangitic carcinomatosis versus pneumonia. Procalcitonin minimal at 0.15 with a normal lactic acid level on admission. Patient started on Rocephin and Zithromax and placed on 5 L of oxygen. Also noted to have an elevated troponin mild. She was seen by cardiology. Felt to be demand ischemia.  On 1/11, oxygen needs have slowly increased up to 10 L high flow and on night of 1/11, patient noted to be in respiratory distress requiring 12 L nasal cannula. Also found to have bibasilar crackles given 40 mg of IV Lasix plus 1 mg of morphine which did help her breathing. Also found to be in acute diastolic CHFand started on IV Lasix. Patient has diuresed over 3 L of fluid since.  After meeting with palliative care and oncology and realizing the poor prognosis or cancer diagnosis had, patient and her family opted to go home with hospice.  She will continue on Lasix as well as other medications for comfort.  Hospice contacted and hospital bed and other equipment should be delivered and ready by 1/15, which is when patient will be discharged.  06/09/20: Patient was seen and examined at bedside.  There were no acute events overnight.  She does not appear to be in distress this morning.  She is currently on 11 L high flow nasal cannula in the setting of markedly bilateral pulmonary infiltrates, lung cancer.  She has no new complaints.  Updated her daughter via phone, okay to obtain BMP to assess electrolyte levels, will obtain a chest x-ray to  re-assess the need for IV diuretics.  Hospital Course:  Principal Problem:   Acute respiratory failure with hypoxia and hypercapnia (HCC) Active Problems:   Symptomatic anemia   Acute on chronic diastolic CHF (congestive heart failure) (HCC)   Pressure injury of skin   Hypertension   Metastatic malignant neoplasm (HCC)   Liver metastasis (HCC)   Lung mass   Goals of care, counseling/discussion  Acute  respiratory failure with hypoxia and hypercapnia (HCC) secondary to extensive lung cancer plus acute on chronic diastolic heart failure: Independently reviewed chest x-ray admission showing bilateral pulmonary infiltrates in the setting of elevated BNP and lung cancer.  Echocardiogram done on 1/9 noted diastolic dysfunction LVEF 55 to 60% with grade 1 diastolic dysfunction.   With normal procalcitonin, antibiotics discontinued. Repeat portable chest x-ray on 06/09/2020, follow results.  Pressure injury of skin: Pressure Injury 06/03/20 Sacrum Medial Stage 2 -  Partial thickness loss of dermis presenting as a shallow open injury with a red, pink wound bed without slough. (Active)  06/03/20 0900  Location: Sacrum  Location Orientation: Medial  Staging: Stage 2 -  Partial thickness loss of dermis presenting as a shallow open injury with a red, pink wound bed without slough.  Wound Description (Comments):   Present on Admission: Yes   Hypertension: Blood pressures are soft today. Continue to closely monitor vital signs  GERD Continue PPI  Acute diastolic CHF She had a 2D echo done on 06/02/2020 which showed preserved LVEF 55 to 60% with grade 1 diastolic dysfunction She has been adequately diuresed with IV Lasix 40 mg twice daily Initial BNP greater than 1000, repeated BNP 147 on 06/09/2020. Net I&O -476 cc. Repeated BMP today.  Electrolytes are stable potassium 3.7 and magnesium 1.9.  Renal function stable, creatinine 0.48 with GFR greater than 60.       Consultants:  Palliative care  Oncology  Procedures:  Echocardiogram done 2/44/01: Grade 1 diastolic dysfunction. Preserved ejection fraction 55 to 60%. Mild to moderate mitral regurgitation   Discharge Exam: BP (!) 117/52 (BP Location: Right Arm)   Pulse 87   Temp 97.8 F (36.6 C) (Oral)   Resp 19   Ht 5\' 2"  (1.575 m)   Wt 61.5 kg   SpO2 92%   BMI 24.80 kg/m  . General: 87 y.o. year-old female chronically  ill-appearing no acute distress.  Alert and interactive.   . Cardiovascular: Regular rate and rhythm no rubs or gallops.  Marland Kitchen Respiratory: Mild rales at bases no wheezing noted.  Poor inspiratory effort. . Abdomen: Soft nontender nondistended normal bowel sounds present. Marland Kitchen Psychiatry: Mood is appropriate for condition and setting.  Discharge Instructions You were cared for by a hospitalist during your hospital stay. If you have any questions about your discharge medications or the care you received while you were in the hospital after you are discharged, you can call the unit and asked to speak with the hospitalist on call if the hospitalist that took care of you is not available. Once you are discharged, your primary care physician will handle any further medical issues. Please note that NO REFILLS for any discharge medications will be authorized once you are discharged, as it is imperative that you return to your primary care physician (or establish a relationship with a primary care physician if you do not have one) for your aftercare needs so that they can reassess your need for medications and monitor your lab values.   Allergies as of 06/09/2020  Reactions   Statins    Strawberry (diagnostic)       Medication List    STOP taking these medications   aspirin EC 81 MG tablet   calcium carbonate 1250 (500 Ca) MG tablet Commonly known as: OS-CAL - dosed in mg of elemental calcium   cetirizine 10 MG tablet Commonly known as: ZYRTEC   cholecalciferol 25 MCG (1000 UNIT) tablet Commonly known as: VITAMIN D3   Dymista 137-50 MCG/ACT Susp Generic drug: Azelastine-Fluticasone   ferrous sulfate 325 (65 FE) MG tablet   Fish Oil 1000 MG Caps   PRESERVISION AREDS 2 PO   vitamin B-12 1000 MCG tablet Commonly known as: CYANOCOBALAMIN     TAKE these medications   Advair Diskus 100-50 MCG/DOSE Aepb Generic drug: Fluticasone-Salmeterol Inhale 2 puffs into the lungs 2 (two) times  daily.   albuterol 108 (90 Base) MCG/ACT inhaler Commonly known as: VENTOLIN HFA Inhale 2 puffs into the lungs 4 (four) times daily as needed for wheezing or shortness of breath.   ALPRAZolam 0.25 MG tablet Commonly known as: XANAX Take 1 tablet (0.25 mg total) by mouth 3 (three) times daily as needed for anxiety or sleep.   Denta 5000 Plus 1.1 % Crea dental cream Generic drug: sodium fluoride Take 1 application by mouth daily.   furosemide 20 MG tablet Commonly known as: Lasix Take 1 tablet (20 mg total) by mouth daily.   lidocaine 5 % Commonly known as: LIDODERM Place 1 patch onto the skin daily. Place on R shoulder.  Remove & Discard patch within 12 hours or as directed by MD   morphine CONCENTRATE 10 MG/0.5ML Soln concentrated solution Place 0.25-0.5 mLs (5-10 mg total) under the tongue every 3 (three) hours as needed for moderate pain (dyspnea/air hunger/tachypnea).   omeprazole 20 MG capsule Commonly known as: PRILOSEC Take 1 capsule (20 mg total) by mouth daily. What changed: how much to take   telmisartan 40 MG tablet Commonly known as: MICARDIS Take 40 mg by mouth daily.   traMADol 50 MG tablet Commonly known as: ULTRAM Take 50 mg by mouth every 12 (twelve) hours as needed for moderate pain.      Allergies  Allergen Reactions  . Statins   . Strawberry (Diagnostic)       The results of significant diagnostics from this hospitalization (including imaging, microbiology, ancillary and laboratory) are listed below for reference.    Significant Diagnostic Studies: DG Chest 1 View  Result Date: 06/02/2020 CLINICAL DATA:  Shortness of breath. EXAM: CHEST  1 VIEW COMPARISON:  Radiograph and CT yesterday.  PET CT 3 days ago FINDINGS: Lower lung volumes from prior exam. Chronic volume loss in the right hemithorax. Pleuroparenchymal opacity at the right lung apex is well as diffuse irregular opacities throughout the right lung, progressive at the right lung base. Left  basilar pulmonary pulmonary nodules on CT are not well-defined by radiograph. Chronic interstitial coarsening throughout the left hemithorax. No pneumothorax or significant pleural effusion.2 bony destructive changes involving the right first and second rib were better seen on prior CT. Surgical hardware in the left proximal humerus. IMPRESSION: 1. Progressive irregular right lung opacities particularly at the bases, favor atelectasis, infection could have a similar appearance. 2. Unchanged volume loss in the right hemithorax with pleuroparenchymal opacity at the apex corresponding to known malignancy. Underlying chronic interstitial lung changes. Many of the known pulmonary nodules are not well seen by radiograph. 3. Known osseous destructive changes about the right first and second ribs.  Electronically Signed   By: Keith Rake M.D.   On: 06/02/2020 03:57   DG Chest 2 View  Result Date: 06/01/2020 CLINICAL DATA:  Shortness of breath. Known exposure to COVID-19 this week. EXAM: CHEST - 2 VIEW COMPARISON:  CT imaging from May 22, 2020 FINDINGS: The known right apical mass is appreciated. The known right middle lobe mass is best appreciated on the lateral view. Known interstitial lung disease is identified. Given the significant chronic changes in the lungs and the lack of a comparison chest x-ray, it would be difficult to exclude mild acute on chronic ground-glass infiltrates. IMPRESSION: Given the significant chronic changes in the lungs and the lack of a comparison chest x-ray, it would be difficult to exclude mild acute on chronic ground-glass infiltrates. Known malignancy on the right as above. Known interstitial lung disease. Electronically Signed   By: Dorise Bullion III M.D   On: 06/01/2020 13:01   CT Angio Chest PE W and/or Wo Contrast  Result Date: 06/01/2020 CLINICAL DATA:  87 year old female with shortness of breath. Right apical mass. EXAM: CT ANGIOGRAPHY CHEST WITH CONTRAST TECHNIQUE:  Multidetector CT imaging of the chest was performed using the standard protocol during bolus administration of intravenous contrast. Multiplanar CT image reconstructions and MIPs were obtained to evaluate the vascular anatomy. CONTRAST:  5mL OMNIPAQUE IOHEXOL 350 MG/ML SOLN COMPARISON:  Chest CT dated 05/02/2020. FINDINGS: Cardiovascular: There is no cardiomegaly or pericardial effusion. Coronary vascular calcification. There is advanced atherosclerotic calcification the thoracic aorta. No aneurysmal dilatation or dissection. Evaluation of the pulmonary arteries is limited due to respiratory motion artifact. No large or central pulmonary artery embolus identified. Mediastinum/Nodes: Mildly enlarged right hilar lymph node measures 12 mm. There is a small hiatal hernia. No mediastinal fluid collection. Lungs/Pleura: Right apical consolidation/mass as seen previously. Multiple bilateral pulmonary nodules consistent with metastatic disease. Interval increase in the size of the nodular with prosthesis since the prior CT. There is an area of consolidation involving the right middle lobe progressed since the prior CT. There is increased interstitial prominence and diffuse ground-glass density throughout the upper lobes and right middle lobe compared to prior CT which may represent progression of lymphangitic carcinomatosis or pneumonia. Clinical correlation is recommended. There is no pleural effusion pneumothorax. The central airways are patent. Upper Abdomen: Faint hepatic hypodense lesions most consistent with metastatic disease. Musculoskeletal: Erosive changes of the right first and second ribs secondary to right apical mass. Review of the MIP images confirms the above findings. IMPRESSION: 1. No CT evidence of central pulmonary artery embolus. 2. Right apical consolidation/mass as seen previously. Interval increase in the size of the metastatic disease compared to prior CT. 3. Interval increase in the interstitial  prominence and diffuse ground-glass density throughout the upper lobes and right middle lobe compared to the prior CT which may represent progression of lymphangitic carcinomatosis or pneumonia. 4. Mildly enlarged right hilar lymph node. 5. Hepatic metastatic disease. 6. Aortic Atherosclerosis (ICD10-I70.0). Electronically Signed   By: Anner Crete M.D.   On: 06/01/2020 21:56   NM PET Image Initial (PI) Skull Base To Thigh  Result Date: 05/31/2020 CLINICAL DATA:  Subsequent treatment strategy for non-small cell lung cancer. EXAM: NUCLEAR MEDICINE PET SKULL BASE TO THIGH TECHNIQUE: 7.5 mCi F-18 FDG was injected intravenously. Full-ring PET imaging was performed from the skull base to thigh after the radiotracer. CT data was obtained and used for attenuation correction and anatomic localization. Fasting blood glucose: 117 mg/dl COMPARISON:  CT chest dated 05/02/2020  FINDINGS: Mediastinal blood pool activity: SUV max 2.0 Liver activity: SUV max NA NECK: 16 mm nodule in the right adrenal gland (series 3/image 54), max SUV 8.4. No hypermetabolic cervical lymphadenopathy. Incidental CT findings: none CHEST: 7.1 cm right apical chest wall mass involving the right anterior 1st and 2nd rib (series 3/image 58), max SUV 17.1. This corresponds to the patient's known primary bronchogenic neoplasm with chest wall involvement. 7.3 cm medial right middle lobe mass (series 3/image 92), max SUV 17.8. 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 Numerous widespread pulmonary metastases, including a 2.0 cm nodule in the left lower lobe (series 3/image 87), max SUV 7.2. No hypermetabolic mediastinal lymphadenopathy. Right axillary nodal metastases measuring up to 12 mm short axis (series 3/image 74), max SUV 7.7. Incidental CT findings: Atherosclerotic calcifications of the aortic arch/root. Coronary atherosclerosis of the LAD and left circumflex. ABDOMEN/PELVIS: Numerous hepatic metastases,  max SUV 15.7 in the right hepatic dome and 10.2 in the lateral segment left hepatic lobe. Possible focal hypermetabolism in the medial spleen, max SUV 4.4, raising concern for a splenic metastasis. No focal hypermetabolism in the pancreas or adrenal glands. Upper abdominal/retroperitoneal lymphadenopathy, including a 14 mm short axis right para-aortic node (series 3/image 145) with max SUV 11.2 and a 13 mm short axis left para-aortic node (series 3/image 152) with max SUV 7.6. Two peritoneal implants along the jejunal mesentery in the left mid abdomen measuring 2.9 and 3.3 cm (series 3/image 167), max SUV 13.9. Additional peritoneal implants, including a dominant 3.7 cm lesion in the left lower pelvis (series 3/image 202), max SUV 18.6. Small volume pelvic ascites. Incidental CT findings: Right lower pole renal cyst. Atherosclerotic calcifications abdominal aorta and branch vessels. Mild sigmoid diverticulosis. SKELETON: Multifocal osseous metastases, including: --Right proximal humerus, max SUV 3.9 --Left posterior 3rd rib, max SUV 4.6 --T7 vertebral body, max SUV 6.2 --T8 posterior spinal process, max SUV 11.0 --Posterior left iliac bone, max SUV 4.4 --Left femoral head, max SUV 14.0 --Right inferior pubic ramus, max SUV 14.3 Soft tissue metastases, including: --Right breast, max SUV 5.3 --Left anterior chest wall, max SUV 10.0 --Right gluteal region, max SUV 11.9 Incidental CT findings: ORIF of the left shoulder/proximal humerus. Degenerative changes of the visualized thoracolumbar spine. IMPRESSION: Right apical mass with involvement of the chest wall, corresponding to the patient's known primary bronchogenic neoplasm. 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 Additional widespread multifocal pulmonary metastases. Numerous hepatic metastases. Possible splenic metastasis. Nodal and peritoneal metastases. Multifocal osseous and soft tissue metastases. Electronically Signed    By: Julian Hy M.D.   On: 05/31/2020 08:41   DG Chest Port 1 View  Result Date: 06/04/2020 CLINICAL DATA:  Shortness of breath EXAM: PORTABLE CHEST 1 VIEW COMPARISON:  06/02/2020 FINDINGS: Extensive bilateral airspace opacities, worsening slightly since prior study. Elevation of the right hemidiaphragm. Small right pleural effusion. Heart is normal size. Aortic atherosclerosis. IMPRESSION: Worsening diffuse bilateral airspace disease. Small right effusion. Electronically Signed   By: Rolm Baptise M.D.   On: 06/04/2020 23:57   ECHOCARDIOGRAM COMPLETE  Result Date: 06/02/2020    ECHOCARDIOGRAM REPORT   Patient Name:   Kim Newman Date of Exam: 06/02/2020 Medical Rec #:  [id number]       Height:       62.0 in Accession #:    [id number]      Weight:       147.0 lb Date of Birth:  Jun 12, 1927  BSA:          1.677 m Patient Age:    69 years        BP:           134/63 mmHg Patient Gender: F               HR:           97 bpm. Exam Location:  [facility] Procedure: 2D Echo, Cardiac Doppler and Color Doppler Indications:     Abnormal ECG R94.31  History:         Patient has no prior history of Echocardiogram examinations.                  Risk Factors:Hypertension.  Sonographer:     Alyse Low Roar Referring Phys:  [id number] [place] Diagnosing Phys: Ida Rogue MD IMPRESSIONS  1. Left ventricular ejection fraction, by estimation, is 55 to 60%. The left ventricle has normal function. The left ventricle has no regional wall motion abnormalities. Left ventricular diastolic parameters are consistent with Grade I diastolic dysfunction (impaired relaxation).  2. Right ventricular systolic function is normal. The right ventricular size is normal. There is normal pulmonary artery systolic pressure. The estimated right ventricular systolic pressure is 32.9 mmHg.  3. The mitral valve is normal in structure. Mild to moderate mitral valve regurgitation. No evidence of mitral stenosis.  4. The aortic valve was not  well visualized. FINDINGS  Left Ventricle: Left ventricular ejection fraction, by estimation, is 55 to 60%. The left ventricle has normal function. The left ventricle has no regional wall motion abnormalities. The left ventricular internal cavity size was normal in size. There is  no left ventricular hypertrophy. Left ventricular diastolic parameters are consistent with Grade I diastolic dysfunction (impaired relaxation). Right Ventricle: The right ventricular size is normal. No increase in right ventricular wall thickness. Right ventricular systolic function is normal. There is normal pulmonary artery systolic pressure. The tricuspid regurgitant velocity is 2.57 m/s, and  with an assumed right atrial pressure of 5 mmHg, the estimated right ventricular systolic pressure is 92.4 mmHg. Left Atrium: Left atrial size was normal in size. Right Atrium: Right atrial size was normal in size. Pericardium: There is no evidence of pericardial effusion. Mitral Valve: The mitral valve is normal in structure. Mild mitral annular calcification. Mild to moderate mitral valve regurgitation. No evidence of mitral valve stenosis. Tricuspid Valve: The tricuspid valve is normal in structure. Tricuspid valve regurgitation is mild . No evidence of tricuspid stenosis. Aortic Valve: The aortic valve was not well visualized. Aortic valve regurgitation is not visualized. No aortic stenosis is present. Aortic valve peak gradient measures 5.3 mmHg. Pulmonic Valve: The pulmonic valve was normal in structure. Pulmonic valve regurgitation is not visualized. No evidence of pulmonic stenosis. Aorta: The aortic root is normal in size and structure. Venous: The inferior vena cava is normal in size with greater than 50% respiratory variability, suggesting right atrial pressure of 3 mmHg. IAS/Shunts: No atrial level shunt detected by color flow Doppler.  LEFT VENTRICLE PLAX 2D LVIDd:         4.30 cm  Diastology LVIDs:         3.00 cm  LV e' medial:     23.70 cm/s LV PW:         0.90 cm  LV E/e' medial:  4.4 LV IVS:        0.90 cm  LV e' lateral:   10.40 cm/s LVOT diam:  1.80 cm  LV E/e' lateral: 10.0 LVOT Area:     2.54 cm  RIGHT VENTRICLE RV Mid diam:    3.40 cm RV S prime:     17.20 cm/s LEFT ATRIUM           Index       RIGHT ATRIUM           Index LA diam:      3.90 cm 2.33 cm/m  RA Area:     12.70 cm LA Vol (A4C): 41.5 ml 24.74 ml/m RA Volume:   29.50 ml  17.59 ml/m  AORTIC VALVE                PULMONIC VALVE AV Area (Vmax): 1.74 cm    PV Vmax:        0.92 m/s AV Vmax:        115.00 cm/s PV Peak grad:   3.4 mmHg AV Peak Grad:   5.3 mmHg    RVOT Peak grad: 2 mmHg LVOT Vmax:      78.60 cm/s  AORTA Ao Root diam: 2.60 cm MITRAL VALVE                TRICUSPID VALVE MV Area (PHT): 4.10 cm     TR Peak grad:   26.4 mmHg MV Decel Time: 185 msec     TR Vmax:        257.00 cm/s MV E velocity: 104.00 cm/s MV A velocity: 129.00 cm/s  SHUNTS MV E/A ratio:  0.81         Systemic Diam: 1.80 cm MV A Prime:    14.9 cm/s Ida Rogue MD Electronically signed by Ida Rogue MD Signature Date/Time: 06/02/2020/5:41:58 PM    Final     Microbiology: Recent Results (from the past 240 hour(s))  Blood culture (routine x 2)     Status: None   Collection Time: 06/01/20  7:02 PM   Specimen: BLOOD  Result Value Ref Range Status   Specimen Description BLOOD LEFT ANTECUBITAL  Final   Special Requests   Final    BOTTLES DRAWN AEROBIC AND ANAEROBIC Blood Culture adequate volume   Culture   Final    NO GROWTH 5 DAYS Performed at [facility], [address] [place], [place] [address]    Report Status 06/06/2020 FINAL  Final  Blood culture (routine x 2)     Status: None   Collection Time: 06/01/20  8:09 PM   Specimen: BLOOD  Result Value Ref Range Status   Specimen Description BLOOD RIGHT ANTECUBITAL  Final   Special Requests   Final    BOTTLES DRAWN AEROBIC AND ANAEROBIC Blood Culture results may not be optimal due to an excessive volume of blood  received in culture bottles   Culture   Final    NO GROWTH 5 DAYS Performed at [facility], [address] [place], [place] [address]    Report Status 06/06/2020 FINAL  Final  Resp Panel by RT-PCR (Flu A&B, Covid) Nasopharyngeal Swab     Status: None   Collection Time: 06/01/20  8:55 PM   Specimen: Nasopharyngeal Swab; Nasopharyngeal(NP) swabs in vial transport medium  Result Value Ref Range Status   SARS Coronavirus 2 by RT PCR NEGATIVE NEGATIVE Final    Comment: (NOTE) SARS-CoV-2 target nucleic acids are NOT DETECTED.  The SARS-CoV-2 RNA is generally detectable in upper respiratory specimens during the acute phase of infection. The lowest concentration of SARS-CoV-2 viral copies this  assay can detect is 138 copies/mL. A negative result does not preclude SARS-Cov-2 infection and should not be used as the sole basis for treatment or other patient management decisions. A negative result may occur with  improper specimen collection/handling, submission of specimen other than nasopharyngeal swab, presence of viral mutation(s) within the areas targeted by this assay, and inadequate number of viral copies(<138 copies/mL). A negative result must be combined with clinical observations, patient history, and epidemiological information. The expected result is Negative.  Fact Sheet for Patients:  EntrepreneurPulse.com.au  Fact Sheet for Healthcare Providers:  IncredibleEmployment.be  This test is no t yet approved or cleared by the [place] FDA and  has been authorized for detection and/or diagnosis of SARS-CoV-2 by FDA under an Emergency Use Authorization (EUA). This EUA will remain  in effect (meaning this test can be used) for the duration of the COVID-19 declaration under Section 564(b)(1) of the Act, 21 U.S.C.section 360bbb-3(b)(1), unless the authorization is terminated  or revoked sooner.       Influenza A by PCR NEGATIVE  NEGATIVE Final   Influenza B by PCR NEGATIVE NEGATIVE Final    Comment: (NOTE) The Xpert Xpress SARS-CoV-2/FLU/RSV plus assay is intended as an aid in the diagnosis of influenza from Nasopharyngeal swab specimens and should not be used as a sole basis for treatment. Nasal washings and aspirates are unacceptable for Xpert Xpress SARS-CoV-2/FLU/RSV testing.  Fact Sheet for Patients: EntrepreneurPulse.com.au  Fact Sheet for Healthcare Providers: IncredibleEmployment.be  This test is not yet approved or cleared by the [place] FDA and has been authorized for detection and/or diagnosis of SARS-CoV-2 by FDA under an Emergency Use Authorization (EUA). This EUA will remain in effect (meaning this test can be used) for the duration of the COVID-19 declaration under Section 564(b)(1) of the Act, 21 U.S.C. section 360bbb-3(b)(1), unless the authorization is terminated or revoked.  Performed at [facility], [address], [place], [place] [address]   Urine Culture     Status: Abnormal   Collection Time: 06/02/20  3:45 PM   Specimen: Urine, Random  Result Value Ref Range Status   Specimen Description   Final    URINE, RANDOM Performed at [facility], [address] [place], [place] [address]    Special Requests   Final    NONE Performed at [facility], [address] [place], [place] [address]    Culture (A)  Final    <10,000 COLONIES/mL INSIGNIFICANT GROWTH Performed at [facility], [place] [address], [place], [place] [address]    Report Status 06/04/2020 FINAL  Final     Labs: Basic Metabolic Panel: Recent Labs  Lab 06/03/20 0417 06/04/20 0622 06/05/20 0318 06/06/20 0204 06/09/20 1200  NA  --  138 139 136 133*  K  --  3.9 3.8 3.5 3.7  CL  --  95* 93* 89* 82*  CO2  --  30 35* 37* 40*  GLUCOSE  --  132* 150* 122* 119*  BUN  --  16 14 14  24*  CREATININE  --  0.56 0.49 0.47 0.48  CALCIUM  --   9.1 8.7* 8.7* 8.6*  MG 1.8  --  1.9  --  1.9   Liver Function Tests: Recent Labs  Lab 06/05/20 0318  AST 62*  ALT 55*  ALKPHOS 101  BILITOT 0.4  PROT 6.4*  ALBUMIN 2.5*   No results for input(s): LIPASE, AMYLASE in the last 168 hours. No results for input(s):  AMMONIA in the last 168 hours. CBC: Recent Labs  Lab 06/03/20 0417 06/04/20 0622 06/05/20 0318 06/06/20 0204  WBC 26.9* 28.7* 26.5* 26.1*  HGB 13.6 14.9 15.1* 15.5*  HCT 42.8 48.2* 47.6* 48.3*  MCV 89.0 90.6 89.8 89.6  PLT 191 215 201 194   Cardiac Enzymes: No results for input(s): CKTOTAL, CKMB, CKMBINDEX, TROPONINI in the last 168 hours. BNP: BNP (last 3 results) Recent Labs    06/05/20 0318 06/09/20 1200  BNP 1,013.8* 147.2*    ProBNP (last 3 results) No results for input(s): PROBNP in the last 8760 hours.  CBG: No results for input(s): GLUCAP in the last 168 hours.     Signed:  Kayleen Memos, MD [facility] 06/09/2020, 1:17 PM

## 2020-06-09 NOTE — Progress Notes (Signed)
   06/07/20 1524  Assess: MEWS Score  Temp 97.7 F (36.5 C)  BP 96/66  Pulse Rate (!) 121  Resp 16  SpO2 92 %  O2 Device Nasal Cannula  Assess: MEWS Score  MEWS Temp 0  MEWS Systolic 1  MEWS Pulse 2  MEWS RR 0  MEWS LOC 0  MEWS Score 3  MEWS Score Color Yellow  Assess: if the MEWS score is Yellow or Red  Were vital signs taken at a resting state? Yes  Focused Assessment Change from prior assessment (see assessment flowsheet)  Early Detection of Sepsis Score *See Row Information* Low  MEWS guidelines implemented *See Row Information* Yes  Treat  MEWS Interventions Escalated (See documentation below)  Pain Scale 0-10  Pain Score 0  Take Vital Signs  Increase Vital Sign Frequency  Yellow: Q 2hr X 2 then Q 4hr X 2, if remains yellow, continue Q 4hrs  Escalate  MEWS: Escalate Yellow: discuss with charge nurse/RN and consider discussing with provider and RRT  Notify: Charge Nurse/RN  Name of Charge Nurse/RN Notified Patrice, RN  Date Charge Nurse/RN Notified 06/07/20  Time Charge Nurse/RN Notified 1524  Notify: Provider  Provider Name/Title Annita Brod, MD  Date Provider Notified 06/07/20  Time Provider Notified 1524  Notification Type Page  Notification Reason Change in status  Response No new orders  Date of Provider Response 06/07/20  Time of Provider Response 1524  Document  Patient Outcome Not stable and remains on department  Progress note created (see row info) Yes  Inserted for Kim Newman

## 2020-06-10 ENCOUNTER — Ambulatory Visit: Payer: Medicare PPO

## 2020-06-10 DIAGNOSIS — C787 Secondary malignant neoplasm of liver and intrahepatic bile duct: Secondary | ICD-10-CM | POA: Diagnosis not present

## 2020-06-10 DIAGNOSIS — J9601 Acute respiratory failure with hypoxia: Secondary | ICD-10-CM | POA: Diagnosis not present

## 2020-06-10 DIAGNOSIS — I5033 Acute on chronic diastolic (congestive) heart failure: Secondary | ICD-10-CM | POA: Diagnosis not present

## 2020-06-10 DIAGNOSIS — J9602 Acute respiratory failure with hypercapnia: Secondary | ICD-10-CM | POA: Diagnosis not present

## 2020-06-10 DIAGNOSIS — D649 Anemia, unspecified: Secondary | ICD-10-CM

## 2020-06-10 DIAGNOSIS — R918 Other nonspecific abnormal finding of lung field: Secondary | ICD-10-CM | POA: Diagnosis not present

## 2020-06-10 MED ORDER — AMOXICILLIN-POT CLAVULANATE 875-125 MG PO TABS: 1.0000 | ORAL_TABLET | Freq: Two times a day (BID) | ORAL | 0 refills | Status: AC

## 2020-06-10 MED ORDER — AMOXICILLIN-POT CLAVULANATE 875-125 MG PO TABS
1.0000 | ORAL_TABLET | Freq: Two times a day (BID) | ORAL | Status: DC
  Administered 2020-06-10 – 2020-06-11 (×3): 1 via ORAL
  Filled 2020-06-10 (×3): qty 1

## 2020-06-10 MED ORDER — IPRATROPIUM-ALBUTEROL 0.5-2.5 (3) MG/3ML IN SOLN
3.0000 mL | Freq: Four times a day (QID) | RESPIRATORY_TRACT | Status: DC
  Administered 2020-06-10 (×2): 3 mL via RESPIRATORY_TRACT
  Filled 2020-06-10 (×2): qty 3

## 2020-06-10 MED ORDER — FUROSEMIDE 20 MG PO TABS
20.0000 mg | ORAL_TABLET | Freq: Every day | ORAL | Status: DC
  Administered 2020-06-10 – 2020-06-11 (×2): 20 mg via ORAL
  Filled 2020-06-10 (×2): qty 1

## 2020-06-10 MED ORDER — AMOXICILLIN-POT CLAVULANATE 875-125 MG PO TABS: 1.0000 | ORAL_TABLET | Freq: Two times a day (BID) | ORAL | Status: DC

## 2020-06-10 NOTE — Discharge Summary (Signed)
Discharge Summary  Kim Newman OHY:[id number] DOB: 09-25-1927  PCP: Baxter Hire, MD  Admit date: 06/01/2020 Discharge date: 06/10/2020  Time spent: 25 minutes   Recommendations for Outpatient Follow-up:  1. Patient going home with hospice. 2. New medication: Lidocaine patch on for 12 hours, off for 12 hours applied to right shoulder. 3. New medication: Lasix 20 mg p.o. daily 4. New medication: Xanax 0.5 mg p.o. 3 times daily as needed 5. New medication: Roxanol 5 to 10 mg under tongue 3 times daily as needed for work of breathing 6. Patient will discontinue the following medications: Aspirin, calcium carbonate, iron, fish oil, multivitamin   Discharge Diagnoses:  Active Hospital Problems   Diagnosis Date Noted  . Acute respiratory failure with hypoxia and hypercapnia (HCC)   . Metastatic malignant neoplasm ([place])   . Liver metastasis ([place])   . Lung mass   . Goals of care, counseling/discussion   . Pressure injury of skin 06/05/2020  . Hypertension   . Acute on chronic diastolic CHF (congestive heart failure) ([place]) 06/01/2020  . Symptomatic anemia 03/17/2016    Resolved Hospital Problems  No resolved problems to display.    Discharge Condition: Guarded  Diet recommendation: Pleasure feedings  Vitals:   06/09/20 2356 06/10/20 0749  BP: (!) 131/55 (!) 136/58  Pulse: 78 87  Resp: 16 14  Temp: 98.1 F (36.7 C) 98 F (36.7 C)  SpO2: 90% 95%    History of present illness:  87 year old female with past medical history of hypertension, obstructive sleep apnea, had a recent diagnosis of lung cancer in mid December 20221 who had been in her usual state of health.  Not on oxygen supplementation at baseline, started having progressively worsening shortness of breath over the past week.  She presented to  at 36,000 the emergency room on 06/01/20 for further evaluation.  COVID-negative. CT scan of the chest concerning for right upper lobe mass with increased groundglass  opacities suggestive of lymphangitic carcinomatosis versus pneumonia. Procalcitonin minimal at 0.15 with a normal lactic acid level on admission. Patient started on Rocephin and Zithromax and placed on 5 L of oxygen. Also noted to have a mildly elevated troponin. She was seen by cardiology. Felt to be demand ischemia.  On 06/04/20, her oxygen demand increased and was noted to be in respiratory distress requiring 12 L HFNC. Also found to have bibasilar crackles given 40 mg of IV Lasix plus 1 mg of morphine which did help her symptomatology. Concern for acute diastolic CHF, was started on IV Lasix.  Diuresed.  After meeting with palliative care and oncology and realizing the poor prognosis of her cancer diagnosis, patient and family opted to go home with hospice care.  She will continue on Lasixand antibiotics as requested by her daughter.  TOC assisting with delivery of equipments/DMEs to her home.  06/10/20: Patient was seen and examined at bedside.  She has no new complaints.  There were no acute events overnight.  Awaiting deliveries of equipments to her home and transportation for discharge.   Hospital Course:  Principal Problem:   Acute respiratory failure with hypoxia and hypercapnia (HCC) Active Problems:   Symptomatic anemia   Acute on chronic diastolic CHF (congestive heart failure) (HCC)   Pressure injury of skin   Hypertension   Metastatic malignant neoplasm (HCC)   Liver metastasis (HCC)   Lung mass   Goals of care, counseling/discussion  Acute respiratory failure with hypoxia and hypercapnia (HCC) secondary to extensive lung cancer plus  acute on chronic diastolic heart failure, pulmonary edema, community-acquired pneumonia: Independently reviewed chest x-ray admission showing bilateral pulmonary infiltrates in the setting of elevated BNP, community-acquired pneumonia, pulmonary edema, and lung cancer.   Echocardiogram done on 11/24/20 noted diastolic dysfunction LVEF 55 to 60%  with grade 1 diastolic dysfunction.   She was diuresed with IV Lasix, switched to p.o. Lasix on 06/10/2020 She received 5 days of azithromycin and Rocephin from the day of admission. Repeat portable chest x-ray on 06/09/2020, showed stable bilateral pulmonary infiltrates, could not rule out pneumonia. Started on Augmentin 1 tablet twice daily x5 days from 06/10/2020. She is currently on 11 L high flow nasal cannula with O2 saturation of 97%.  Community-acquired pneumonia, POA Initially treated on admission, completed 5 days of Rocephin and azithromycin Chest x-ray was obtained on 06/09/2020, could not rule out pneumonia, started on Augmentin 1 tablet twice daily x5 days. Plan is to discharge to home with hospice care, per her daughter okay to send home with antibiotics and Lasix. DuoNebs every 6 hours.  Pressure injury of skin: Pressure Injury 06/03/20 Sacrum Medial Stage 2 -  Partial thickness loss of dermis presenting as a shallow open injury with a red, pink wound bed without slough. (Active)  06/03/20 0900  Location: Sacrum  Location Orientation: Medial  Staging: Stage 2 -  Partial thickness loss of dermis presenting as a shallow open injury with a red, pink wound bed without slough.  Wound Description (Comments):   Present on Admission: Yes   Hypertension: BPs are stable. Continue to closely monitor vital signs  GERD Stable Continue PPI  Acute diastolic CHF She had a 2D echo done on 06/02/2020 which showed preserved LVEF 55 to 60% with grade 1 diastolic dysfunction She has been adequately diuresed with IV Lasix 40 mg twice daily Initial BNP greater than 1000, repeated BNP 147 on 06/09/2020. Net I&O -476 cc. Electrolytes are stable potassium 3.7 and magnesium 1.9.  Renal function stable, creatinine 0.48 with GFR greater than 60.  Goals of care  Palliative care team following Plan is to discharge to home with hospice care once equipment and DME's are delivered at home, possibly  on 06/11/2020.      Consultants:  Palliative care  Oncology  TOC  Procedures:  Echocardiogram done 0/25/42: Grade 1 diastolic dysfunction. Preserved ejection fraction 55 to 60%. Mild to moderate mitral regurgitation   Discharge Exam: BP (!) 136/58 (BP Location: Left Arm)   Pulse 87   Temp 98 F (36.7 C)   Resp 14   Ht 5\' 2"  (1.575 m)   Wt 64.6 kg   SpO2 95%   BMI 26.06 kg/m  . General: 87 y.o. year-old female chronically ill-appearing no acute distress.  Alert and interactive.   . Cardiovascular: Regular rate and rhythm no rubs gallops. Marland Kitchen Respiratory: Mild rales diffusely mild wheezing also noted.  Poor inspiratory effort.   . Abdomen: Soft nontender normal bowel sounds present.  Marland Kitchen Psychiatry: Mood is appropriate recommendations will include  Discharge Instructions You were cared for by a hospitalist during your hospital stay. If you have any questions about your discharge medications or the care you received while you were in the hospital after you are discharged, you can call the unit and asked to speak with the hospitalist on call if the hospitalist that took care of you is not available. Once you are discharged, your primary care physician will handle any further medical issues. Please note that NO REFILLS for any discharge medications  will be authorized once you are discharged, as it is imperative that you return to your primary care physician (or establish a relationship with a primary care physician if you do not have one) for your aftercare needs so that they can reassess your need for medications and monitor your lab values.   Allergies as of 06/10/2020      Reactions   Statins    Strawberry (diagnostic)       Medication List    STOP taking these medications   aspirin EC 81 MG tablet   calcium carbonate 1250 (500 Ca) MG tablet Commonly known as: OS-CAL - dosed in mg of elemental calcium   cetirizine 10 MG tablet Commonly known as: ZYRTEC    cholecalciferol 25 MCG (1000 UNIT) tablet Commonly known as: VITAMIN D3   Dymista 137-50 MCG/ACT Susp Generic drug: Azelastine-Fluticasone   ferrous sulfate 325 (65 FE) MG tablet   Fish Oil 1000 MG Caps   PRESERVISION AREDS 2 PO   vitamin B-12 1000 MCG tablet Commonly known as: CYANOCOBALAMIN     TAKE these medications   Advair Diskus 100-50 MCG/DOSE Aepb Generic drug: Fluticasone-Salmeterol Inhale 2 puffs into the lungs 2 (two) times daily.   albuterol 108 (90 Base) MCG/ACT inhaler Commonly known as: VENTOLIN HFA Inhale 2 puffs into the lungs 4 (four) times daily as needed for wheezing or shortness of breath.   ALPRAZolam 0.25 MG tablet Commonly known as: XANAX Take 1 tablet (0.25 mg total) by mouth 3 (three) times daily as needed for anxiety or sleep.   amoxicillin-clavulanate 875-125 MG tablet Commonly known as: AUGMENTIN Take 1 tablet by mouth every 12 (twelve) hours for 5 days.   Denta 5000 Plus 1.1 % Crea dental cream Generic drug: sodium fluoride Take 1 application by mouth daily.   furosemide 20 MG tablet Commonly known as: Lasix Take 1 tablet (20 mg total) by mouth daily.   lidocaine 5 % Commonly known as: LIDODERM Place 1 patch onto the skin daily. Place on R shoulder.  Remove & Discard patch within 12 hours or as directed by MD   morphine CONCENTRATE 10 MG/0.5ML Soln concentrated solution Place 0.25-0.5 mLs (5-10 mg total) under the tongue every 3 (three) hours as needed for moderate pain (dyspnea/air hunger/tachypnea).   omeprazole 20 MG capsule Commonly known as: PRILOSEC Take 1 capsule (20 mg total) by mouth daily. What changed: how much to take   telmisartan 40 MG tablet Commonly known as: MICARDIS Take 40 mg by mouth daily.   traMADol 50 MG tablet Commonly known as: ULTRAM Take 50 mg by mouth every 12 (twelve) hours as needed for moderate pain.      Allergies  Allergen Reactions  . Statins   . Strawberry (Diagnostic)       The  results of significant diagnostics from this hospitalization (including imaging, microbiology, ancillary and laboratory) are listed below for reference.    Significant Diagnostic Studies: DG Chest 1 View  Result Date: 06/02/2020 CLINICAL DATA:  Shortness of breath. EXAM: CHEST  1 VIEW COMPARISON:  Radiograph and CT yesterday.  PET CT 3 days ago FINDINGS: Lower lung volumes from prior exam. Chronic volume loss in the right hemithorax. Pleuroparenchymal opacity at the right lung apex is well as diffuse irregular opacities throughout the right lung, progressive at the right lung base. Left basilar pulmonary pulmonary nodules on CT are not well-defined by radiograph. Chronic interstitial coarsening throughout the left hemithorax. No pneumothorax or significant pleural effusion.2 bony destructive changes involving  the right first and second rib were better seen on prior CT. Surgical hardware in the left proximal humerus. IMPRESSION: 1. Progressive irregular right lung opacities particularly at the bases, favor atelectasis, infection could have a similar appearance. 2. Unchanged volume loss in the right hemithorax with pleuroparenchymal opacity at the apex corresponding to known malignancy. Underlying chronic interstitial lung changes. Many of the known pulmonary nodules are not well seen by radiograph. 3. Known osseous destructive changes about the right first and second ribs. Electronically Signed   By: Keith Rake M.D.   On: 06/02/2020 03:57   DG Chest 2 View  Result Date: 06/01/2020 CLINICAL DATA:  Shortness of breath. Known exposure to COVID-19 this week. EXAM: CHEST - 2 VIEW COMPARISON:  CT imaging from May 22, 2020 FINDINGS: The known right apical mass is appreciated. The known right middle lobe mass is best appreciated on the lateral view. Known interstitial lung disease is identified. Given the significant chronic changes in the lungs and the lack of a comparison chest x-ray, it would be  difficult to exclude mild acute on chronic ground-glass infiltrates. IMPRESSION: Given the significant chronic changes in the lungs and the lack of a comparison chest x-ray, it would be difficult to exclude mild acute on chronic ground-glass infiltrates. Known malignancy on the right as above. Known interstitial lung disease. Electronically Signed   By: Dorise Bullion III M.D   On: 06/01/2020 13:01   CT Angio Chest PE W and/or Wo Contrast  Result Date: 06/01/2020 CLINICAL DATA:  87 year old female with shortness of breath. Right apical mass. EXAM: CT ANGIOGRAPHY CHEST WITH CONTRAST TECHNIQUE: Multidetector CT imaging of the chest was performed using the standard protocol during bolus administration of intravenous contrast. Multiplanar CT image reconstructions and MIPs were obtained to evaluate the vascular anatomy. CONTRAST:  21mL OMNIPAQUE IOHEXOL 350 MG/ML SOLN COMPARISON:  Chest CT dated 05/02/2020. FINDINGS: Cardiovascular: There is no cardiomegaly or pericardial effusion. Coronary vascular calcification. There is advanced atherosclerotic calcification the thoracic aorta. No aneurysmal dilatation or dissection. Evaluation of the pulmonary arteries is limited due to respiratory motion artifact. No large or central pulmonary artery embolus identified. Mediastinum/Nodes: Mildly enlarged right hilar lymph node measures 12 mm. There is a small hiatal hernia. No mediastinal fluid collection. Lungs/Pleura: Right apical consolidation/mass as seen previously. Multiple bilateral pulmonary nodules consistent with metastatic disease. Interval increase in the size of the nodular with prosthesis since the prior CT. There is an area of consolidation involving the right middle lobe progressed since the prior CT. There is increased interstitial prominence and diffuse ground-glass density throughout the upper lobes and right middle lobe compared to prior CT which may represent progression of lymphangitic carcinomatosis or  pneumonia. Clinical correlation is recommended. There is no pleural effusion pneumothorax. The central airways are patent. Upper Abdomen: Faint hepatic hypodense lesions most consistent with metastatic disease. Musculoskeletal: Erosive changes of the right first and second ribs secondary to right apical mass. Review of the MIP images confirms the above findings. IMPRESSION: 1. No CT evidence of central pulmonary artery embolus. 2. Right apical consolidation/mass as seen previously. Interval increase in the size of the metastatic disease compared to prior CT. 3. Interval increase in the interstitial prominence and diffuse ground-glass density throughout the upper lobes and right middle lobe compared to the prior CT which may represent progression of lymphangitic carcinomatosis or pneumonia. 4. Mildly enlarged right hilar lymph node. 5. Hepatic metastatic disease. 6. Aortic Atherosclerosis (ICD10-I70.0). Electronically Signed   By: Anner Crete  M.D.   On: 06/01/2020 21:56   NM PET Image Initial (PI) Skull Base To Thigh  Result Date: 05/31/2020 CLINICAL DATA:  Subsequent treatment strategy for non-small cell lung cancer. EXAM: NUCLEAR MEDICINE PET SKULL BASE TO THIGH TECHNIQUE: 7.5 mCi F-18 FDG was injected intravenously. Full-ring PET imaging was performed from the skull base to thigh after the radiotracer. CT data was obtained and used for attenuation correction and anatomic localization. Fasting blood glucose: 117 mg/dl COMPARISON:  CT chest dated 05/02/2020 FINDINGS: Mediastinal blood pool activity: SUV max 2.0 Liver activity: SUV max NA NECK: 16 mm nodule in the right adrenal gland (series 3/image 54), max SUV 8.4. No hypermetabolic cervical lymphadenopathy. Incidental CT findings: none CHEST: 7.1 cm right apical chest wall mass involving the right anterior 1st and 2nd rib (series 3/image 58), max SUV 17.1. This corresponds to the patient's known primary bronchogenic neoplasm with chest wall involvement.  7.3 cm medial right middle lobe mass (series 3/image 92), max SUV 17.8. 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 Numerous widespread pulmonary metastases, including a 2.0 cm nodule in the left lower lobe (series 3/image 87), max SUV 7.2. No hypermetabolic mediastinal lymphadenopathy. Right axillary nodal metastases measuring up to 12 mm short axis (series 3/image 74), max SUV 7.7. Incidental CT findings: Atherosclerotic calcifications of the aortic arch/root. Coronary atherosclerosis of the LAD and left circumflex. ABDOMEN/PELVIS: Numerous hepatic metastases, max SUV 15.7 in the right hepatic dome and 10.2 in the lateral segment left hepatic lobe. Possible focal hypermetabolism in the medial spleen, max SUV 4.4, raising concern for a splenic metastasis. No focal hypermetabolism in the pancreas or adrenal glands. Upper abdominal/retroperitoneal lymphadenopathy, including a 14 mm short axis right para-aortic node (series 3/image 145) with max SUV 11.2 and a 13 mm short axis left para-aortic node (series 3/image 152) with max SUV 7.6. Two peritoneal implants along the jejunal mesentery in the left mid abdomen measuring 2.9 and 3.3 cm (series 3/image 167), max SUV 13.9. Additional peritoneal implants, including a dominant 3.7 cm lesion in the left lower pelvis (series 3/image 202), max SUV 18.6. Small volume pelvic ascites. Incidental CT findings: Right lower pole renal cyst. Atherosclerotic calcifications abdominal aorta and branch vessels. Mild sigmoid diverticulosis. SKELETON: Multifocal osseous metastases, including: --Right proximal humerus, max SUV 3.9 --Left posterior 3rd rib, max SUV 4.6 --T7 vertebral body, max SUV 6.2 --T8 posterior spinal process, max SUV 11.0 --Posterior left iliac bone, max SUV 4.4 --Left femoral head, max SUV 14.0 --Right inferior pubic ramus, max SUV 14.3 Soft tissue metastases, including: --Right breast, max SUV 5.3 --Left anterior chest wall,  max SUV 10.0 --Right gluteal region, max SUV 11.9 Incidental CT findings: ORIF of the left shoulder/proximal humerus. Degenerative changes of the visualized thoracolumbar spine. IMPRESSION: Right apical mass with involvement of the chest wall, corresponding to the patient's known primary bronchogenic neoplasm. 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 Additional widespread multifocal pulmonary metastases. Numerous hepatic metastases. Possible splenic metastasis. Nodal and peritoneal metastases. Multifocal osseous and soft tissue metastases. Electronically Signed   By: Julian Hy M.D.   On: 05/31/2020 08:41   DG Chest Port 1 View  Result Date: 06/09/2020 CLINICAL DATA:  Hypoxia. EXAM: PORTABLE CHEST 1 VIEW COMPARISON:  June 04, 2020. FINDINGS: Stable cardiomediastinal silhouette. No pneumothorax is noted. Stable right apical scarring and pleural thickening is noted. Stable elevated right hemidiaphragm is noted. Stable bilateral lung opacities are noted concerning for multifocal pneumonia. No pleural effusion is noted.  Bony thorax is unremarkable. IMPRESSION: Stable bilateral lung opacities are noted concerning for multifocal pneumonia. Aortic Atherosclerosis (ICD10-I70.0). Electronically Signed   By: Marijo Conception M.D.   On: 06/09/2020 14:29   DG Chest Port 1 View  Result Date: 06/04/2020 CLINICAL DATA:  Shortness of breath EXAM: PORTABLE CHEST 1 VIEW COMPARISON:  06/02/2020 FINDINGS: Extensive bilateral airspace opacities, worsening slightly since prior study. Elevation of the right hemidiaphragm. Small right pleural effusion. Heart is normal size. Aortic atherosclerosis. IMPRESSION: Worsening diffuse bilateral airspace disease. Small right effusion. Electronically Signed   By: Rolm Baptise M.D.   On: 06/04/2020 23:57   ECHOCARDIOGRAM COMPLETE  Result Date: 06/02/2020    ECHOCARDIOGRAM REPORT   Patient Name:   XEE HOLLMAN Date of Exam: 06/02/2020  Medical Rec #:  [id number]       Height:       62.0 in Accession #:    [id number]      Weight:       147.0 lb Date of Birth:  02/06/1928        BSA:          1.677 m Patient Age:    39 years        BP:           134/63 mmHg Patient Gender: F               HR:           97 bpm. Exam Location:  [facility] Procedure: 2D Echo, Cardiac Doppler and Color Doppler Indications:     Abnormal ECG R94.31  History:         Patient has no prior history of Echocardiogram examinations.                  Risk Factors:Hypertension.  Sonographer:     Alyse Low Roar Referring Phys:  [id number] [place] Diagnosing Phys: Ida Rogue MD IMPRESSIONS  1. Left ventricular ejection fraction, by estimation, is 55 to 60%. The left ventricle has normal function. The left ventricle has no regional wall motion abnormalities. Left ventricular diastolic parameters are consistent with Grade I diastolic dysfunction (impaired relaxation).  2. Right ventricular systolic function is normal. The right ventricular size is normal. There is normal pulmonary artery systolic pressure. The estimated right ventricular systolic pressure is 32.4 mmHg.  3. The mitral valve is normal in structure. Mild to moderate mitral valve regurgitation. No evidence of mitral stenosis.  4. The aortic valve was not well visualized. FINDINGS  Left Ventricle: Left ventricular ejection fraction, by estimation, is 55 to 60%. The left ventricle has normal function. The left ventricle has no regional wall motion abnormalities. The left ventricular internal cavity size was normal in size. There is  no left ventricular hypertrophy. Left ventricular diastolic parameters are consistent with Grade I diastolic dysfunction (impaired relaxation). Right Ventricle: The right ventricular size is normal. No increase in right ventricular wall thickness. Right ventricular systolic function is normal. There is normal pulmonary artery systolic pressure. The tricuspid regurgitant velocity is 2.57 m/s,  and  with an assumed right atrial pressure of 5 mmHg, the estimated right ventricular systolic pressure is 40.1 mmHg. Left Atrium: Left atrial size was normal in size. Right Atrium: Right atrial size was normal in size. Pericardium: There is no evidence of pericardial effusion. Mitral Valve: The mitral valve is normal in structure. Mild mitral annular calcification. Mild to moderate mitral valve regurgitation. No evidence of mitral valve stenosis. Tricuspid Valve: The  tricuspid valve is normal in structure. Tricuspid valve regurgitation is mild . No evidence of tricuspid stenosis. Aortic Valve: The aortic valve was not well visualized. Aortic valve regurgitation is not visualized. No aortic stenosis is present. Aortic valve peak gradient measures 5.3 mmHg. Pulmonic Valve: The pulmonic valve was normal in structure. Pulmonic valve regurgitation is not visualized. No evidence of pulmonic stenosis. Aorta: The aortic root is normal in size and structure. Venous: The inferior vena cava is normal in size with greater than 50% respiratory variability, suggesting right atrial pressure of 3 mmHg. IAS/Shunts: No atrial level shunt detected by color flow Doppler.  LEFT VENTRICLE PLAX 2D LVIDd:         4.30 cm  Diastology LVIDs:         3.00 cm  LV e' medial:    23.70 cm/s LV PW:         0.90 cm  LV E/e' medial:  4.4 LV IVS:        0.90 cm  LV e' lateral:   10.40 cm/s LVOT diam:     1.80 cm  LV E/e' lateral: 10.0 LVOT Area:     2.54 cm  RIGHT VENTRICLE RV Mid diam:    3.40 cm RV S prime:     17.20 cm/s LEFT ATRIUM           Index       RIGHT ATRIUM           Index LA diam:      3.90 cm 2.33 cm/m  RA Area:     12.70 cm LA Vol (A4C): 41.5 ml 24.74 ml/m RA Volume:   29.50 ml  17.59 ml/m  AORTIC VALVE                PULMONIC VALVE AV Area (Vmax): 1.74 cm    PV Vmax:        0.92 m/s AV Vmax:        115.00 cm/s PV Peak grad:   3.4 mmHg AV Peak Grad:   5.3 mmHg    RVOT Peak grad: 2 mmHg LVOT Vmax:      78.60 cm/s  AORTA Ao Root  diam: 2.60 cm MITRAL VALVE                TRICUSPID VALVE MV Area (PHT): 4.10 cm     TR Peak grad:   26.4 mmHg MV Decel Time: 185 msec     TR Vmax:        257.00 cm/s MV E velocity: 104.00 cm/s MV A velocity: 129.00 cm/s  SHUNTS MV E/A ratio:  0.81         Systemic Diam: 1.80 cm MV A Prime:    14.9 cm/s Ida Rogue MD Electronically signed by Ida Rogue MD Signature Date/Time: 06/02/2020/5:41:58 PM    Final     Microbiology: Recent Results (from the past 240 hour(s))  Blood culture (routine x 2)     Status: None   Collection Time: 06/01/20  7:02 PM   Specimen: BLOOD  Result Value Ref Range Status   Specimen Description BLOOD LEFT ANTECUBITAL  Final   Special Requests   Final    BOTTLES DRAWN AEROBIC AND ANAEROBIC Blood Culture adequate volume   Culture   Final    NO GROWTH 5 DAYS Performed at [facility], [address], [place], [place] [id number]    Report Status 06/06/2020 FINAL  Final  Blood culture (routine x 2)  Status: None   Collection Time: 06/01/20  8:09 PM   Specimen: BLOOD  Result Value Ref Range Status   Specimen Description BLOOD RIGHT ANTECUBITAL  Final   Special Requests   Final    BOTTLES DRAWN AEROBIC AND ANAEROBIC Blood Culture results may not be optimal due to an excessive volume of blood received in culture bottles   Culture   Final    NO GROWTH 5 DAYS Performed at [facility], [address] [place], [place] [address]    Report Status 06/06/2020 FINAL  Final  Resp Panel by RT-PCR (Flu A&B, Covid) Nasopharyngeal Swab     Status: None   Collection Time: 06/01/20  8:55 PM   Specimen: Nasopharyngeal Swab; Nasopharyngeal(NP) swabs in vial transport medium  Result Value Ref Range Status   SARS Coronavirus 2 by RT PCR NEGATIVE NEGATIVE Final    Comment: (NOTE) SARS-CoV-2 target nucleic acids are NOT DETECTED.  The SARS-CoV-2 RNA is generally detectable in upper respiratory specimens during the acute phase of infection. The  lowest concentration of SARS-CoV-2 viral copies this assay can detect is 138 copies/mL. A negative result does not preclude SARS-Cov-2 infection and should not be used as the sole basis for treatment or other patient management decisions. A negative result may occur with  improper specimen collection/handling, submission of specimen other than nasopharyngeal swab, presence of viral mutation(s) within the areas targeted by this assay, and inadequate number of viral copies(<138 copies/mL). A negative result must be combined with clinical observations, patient history, and epidemiological information. The expected result is Negative.  Fact Sheet for Patients:  EntrepreneurPulse.com.au  Fact Sheet for Healthcare Providers:  IncredibleEmployment.be  This test is no t yet approved or cleared by the [place] FDA and  has been authorized for detection and/or diagnosis of SARS-CoV-2 by FDA under an Emergency Use Authorization (EUA). This EUA will remain  in effect (meaning this test can be used) for the duration of the COVID-19 declaration under Section 564(b)(1) of the Act, 21 U.S.C.section 360bbb-3(b)(1), unless the authorization is terminated  or revoked sooner.       Influenza A by PCR NEGATIVE NEGATIVE Final   Influenza B by PCR NEGATIVE NEGATIVE Final    Comment: (NOTE) The Xpert Xpress SARS-CoV-2/FLU/RSV plus assay is intended as an aid in the diagnosis of influenza from Nasopharyngeal swab specimens and should not be used as a sole basis for treatment. Nasal washings and aspirates are unacceptable for Xpert Xpress SARS-CoV-2/FLU/RSV testing.  Fact Sheet for Patients: EntrepreneurPulse.com.au  Fact Sheet for Healthcare Providers: IncredibleEmployment.be  This test is not yet approved or cleared by the [place] FDA and has been authorized for detection and/or diagnosis of SARS-CoV-2 by FDA under  an Emergency Use Authorization (EUA). This EUA will remain in effect (meaning this test can be used) for the duration of the COVID-19 declaration under Section 564(b)(1) of the Act, 21 U.S.C. section 360bbb-3(b)(1), unless the authorization is terminated or revoked.  Performed at [facility], [address], [place], [place] [address]   Urine Culture     Status: Abnormal   Collection Time: 06/02/20  3:45 PM   Specimen: Urine, Random  Result Value Ref Range Status   Specimen Description   Final    URINE, RANDOM Performed at [facility], [address] [place],  [address]    Special Requests   Final    NONE Performed at [facility], [address] [place],  [address]  Culture (A)  Final    <10,000 COLONIES/mL INSIGNIFICANT GROWTH Performed at [facility] [address], [place], [place] [address]    Report Status 06/04/2020 FINAL  Final     Labs: Basic Metabolic Panel: Recent Labs  Lab 06/04/20 0622 06/05/20 0318 06/06/20 0204 06/09/20 1200  NA 138 139 136 133*  K 3.9 3.8 3.5 3.7  CL 95* 93* 89* 82*  CO2 30 35* 37* 40*  GLUCOSE 132* 150* 122* 119*  BUN 16 14 14  24*  CREATININE 0.56 0.49 0.47 0.48  CALCIUM 9.1 8.7* 8.7* 8.6*  MG  --  1.9  --  1.9   Liver Function Tests: Recent Labs  Lab 06/05/20 0318  AST 62*  ALT 55*  ALKPHOS 101  BILITOT 0.4  PROT 6.4*  ALBUMIN 2.5*   No results for input(s): LIPASE, AMYLASE in the last 168 hours. No results for input(s): AMMONIA in the last 168 hours. CBC: Recent Labs  Lab 06/04/20 0622 06/05/20 0318 06/06/20 0204  WBC 28.7* 26.5* 26.1*  HGB 14.9 15.1* 15.5*  HCT 48.2* 47.6* 48.3*  MCV 90.6 89.8 89.6  PLT 215 201 194   Cardiac Enzymes: No results for input(s): CKTOTAL, CKMB, CKMBINDEX, TROPONINI in the last 168 hours. BNP: BNP (last 3 results) Recent Labs    06/05/20 0318 06/09/20 1200  BNP 1,013.8* 147.2*    ProBNP (last 3 results) No results  for input(s): PROBNP in the last 8760 hours.  CBG: No results for input(s): GLUCAP in the last 168 hours.     Signed:  Kayleen Memos, MD [facility] 06/10/2020, 8:50 AM

## 2020-06-10 NOTE — TOC Progression Note (Signed)
Transition of Care [facility]) - Progression Note    Patient Details  Name: Kim Newman MRN: [id number] Date of Birth: 1928/04/11  Transition of Care [facility]) CM/SW [place], RN Phone Number: 06/10/2020, 9:20 AM  Clinical Narrative:   Patient discharge pending delivery of equipment as well as availability of non emergent transport.     Expected Discharge Plan: Home w Hospice Care Barriers to Discharge: Equipment Delay  Expected Discharge Plan and Services Expected Discharge Plan: [place] arrangements for the past 2 months: Single Family Home Expected Discharge Date: 06/10/20                                     Social Determinants of Health (SDOH) Interventions    Readmission Risk Interventions No flowsheet data found.

## 2020-06-10 NOTE — Care Management Important Message (Signed)
Important Message  Patient Details  Name: Kim Newman MRN: [id number] Date of Birth: 1927/09/18   Medicare Important Message Given:  Yes     [name] 06/10/2020, 10:50 AM

## 2020-06-10 NOTE — Progress Notes (Signed)
[facility]) Hospital Liaison RN note:  Spoke with daughter, Tye Maryland over the phone to provide update. She reports that DME has not been able to be delivered today. She has no questions or concerns. [place] Liaison will follow up tomorrow to coordinate discharge when DME is in place.  Please call with any hospice related questions or concerns.  Thank you.  Zandra Abts, RN [name] Liaison [phone]

## 2020-06-10 NOTE — Progress Notes (Signed)
Daily Progress Note   Patient Name: Kim Newman       Date: 06/10/2020 DOB: 02-02-1928  Age: 87 y.o. MRN#: [id number] Attending Physician: Kayleen Memos, DO Primary Care Physician: Baxter Hire, MD Admit Date: 06/01/2020  Reason for Consultation/Follow-up: Establishing goals of care  Subjective: Patient awake, alert, oriented. In good spirits. Denies complains including pain or shortness of breath. She is not requiring frequent prns. She understands pending DME home delivery before she can safely discharge home with hospice. No needs.   GOC:  F/u with daughter, Kim Newman via telephone. Waiting on DME delivery, hopefully today. Confirmed plan for home with hospice. Discussed symptom management medications. Answered questions and encouraged Kim Newman to call PMT provider this afternoon if questions or concerns.    Length of Stay: 9  Current Medications: Scheduled Meds:  . amoxicillin-clavulanate  1 tablet Oral Q12H  . aspirin EC  81 mg Oral BID  . fluticasone  1 spray Each Nare BID   And  . azelastine  1 spray Each Nare BID  . enoxaparin (LOVENOX) injection  40 mg Subcutaneous Q24H  . ferrous sulfate  325 mg Oral BID WC  . furosemide  20 mg Oral Daily  . lidocaine  1 patch Transdermal Q24H  . loratadine  10 mg Oral Daily  . losartan  50 mg Oral Daily  . metoprolol tartrate  25 mg Oral BID  .  morphine injection  1 mg Intravenous Once  . multivitamin with minerals  1 tablet Oral Daily  . pantoprazole  40 mg Oral Daily  . vitamin B-12  1,000 mcg Oral BID    Continuous Infusions:   PRN Meds: albuterol, ALPRAZolam, haloperidol lactate, morphine CONCENTRATE, senna  Physical Exam Vitals and nursing note reviewed.  Constitutional:      General: She is awake.     Appearance: She is  ill-appearing.  HENT:     Head: Normocephalic and atraumatic.  Pulmonary:     Effort: No tachypnea, accessory muscle usage or respiratory distress.     Comments: 12L [place] Abdominal:     Tenderness: There is no abdominal tenderness.  Skin:    General: Skin is warm and dry.  Neurological:     Mental Status: She is alert and oriented to person, place, and time.  Psychiatric:  Mood and Affect: Mood normal.        Speech: Speech normal.        Behavior: Behavior normal.        Cognition and Memory: Cognition normal.             Vital Signs: BP (!) 146/47 (BP Location: Right Arm)   Pulse 88   Temp 98 F (36.7 C) (Oral)   Resp 16   Ht 5\' 2"  (1.575 m)   Wt 64.6 kg   SpO2 97%   BMI 26.06 kg/m  SpO2: SpO2: 97 % O2 Device: O2 Device: Nasal Cannula O2 Flow Rate: O2 Flow Rate (L/min): 11 L/min  Intake/output summary:   Intake/Output Summary (Last 24 hours) at 06/10/2020 1138 Last data filed at 06/10/2020 0436 Gross per 24 hour  Intake 480 ml  Output 500 ml  Net -20 ml   LBM: Last BM Date: 06/05/20 Baseline Weight: Weight: 66.7 kg Most recent weight: Weight: 64.6 kg       Palliative Assessment/Data: PPS 40%      Patient Active Problem List   Diagnosis Date Noted  . Metastatic malignant neoplasm ([place])   . Liver metastasis ([place])   . Lung mass   . Goals of care, counseling/discussion   . Pressure injury of skin 06/05/2020  . Hypertension   . Acute respiratory failure with hypoxia and hypercapnia ([facility])   . Metastatic primary lung cancer, right ([place]) 06/01/2020  . Acute on chronic diastolic CHF (congestive heart failure) ([place]) 06/01/2020  . Iron deficiency anemia due to chronic blood loss 04/01/2016  . Symptomatic anemia 03/17/2016    Palliative Care Assessment & Plan   Patient Profile: 87 y.o. female  with past medical history of smoking, hypertension, obstructive sleep apnea, lung nodules (incidential finding 4 years ago), and recent finding lung  mass/suspected lung cancer admitted on 06/01/2020 with shortness of breath, cough, and weakness. In ED patient hypoxic requiring oxygen supplementation. CT chest concerning for right upper lobe mass and increased ground glass opacities suggesting lymphangitic carcinomatosis versus pneumonia. Hospital admission for acute hypoxemic respiratory failure started on antibiotics for community-acquired pneumonia. On 1/12 early AM, rapid response called for tachycardia, hypoxia on 12L, and worsening respiratory status. The patient is requiring 12L HFNC this afternoon.   Of note, patient was evaluated by Dr. Donella Stade on 05/09/20 with concern for stage IV non-small cell lung cancer. PET scan ordered to complete staging and determine extent of disease and targets for palliative radiation. Plan was to see medical oncology as well. PET scan 05/30/20 reveals right apical mass with involvement of chest wall, corresponding with primary bronchogenic neoplasm. 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   Assessment: Acute respiratory failure with hypoxia and hypercapnia Widespread metastatic disease, suspicion for primary lung cancer Acute on chronic diastolic heart failure Dyspnea  Recommendations/Plan: Appreciate oncology recommendations. [facility] on 06/07/20. Patient/family confirm plan to forgo oncology workup including biopsy and chemotherapy, understanding poor prognosis. Patient/family share that comfort and quality of life are important moving forward. Patient wishes to return home and spend time with family.  Medical optimization inpatient with discharge plan home with hospice. Waiting on DME delivery. [name] following.  Symptom management Roxanol 5-10mg  SL q3h prn pain/dyspnea/air hunger/tachypnea Lidocaine patch right  shoulder Xanax 0.25mg  PO TID prn anxiety/sleep Senna daily prn  Comfort feeds per patient/family request. Hopefully home with hospice  tomorrow 1/18.   Code Status: DNR   Code Status Orders  (From admission, onward)         Start     Ordered   06/01/20 2348  Do not attempt resuscitation (DNR)  Continuous       Question Answer Comment  In the event of cardiac or respiratory ARREST Do not call a "code blue"   In the event of cardiac or respiratory ARREST Do not perform Intubation, CPR, defibrillation or ACLS   In the event of cardiac or respiratory ARREST Use medication by any route, position, wound care, and other measures to relive pain and suffering. May use oxygen, suction and manual treatment of airway obstruction as needed for comfort.      06/01/20 2347        Code Status History    Date Active Date Inactive Code Status Order ID Comments User Context   06/01/2020 2242 06/01/2020 2347 Full Code [id number]  Artist Beach, MD ED   03/17/2016 1654 03/18/2016 1929 Full Code [id number]  Hower, Aaron Mose, MD ED   Advance Care Planning Activity    Advance Directive Documentation   Flowsheet Row Most Recent Value  Type of Advance Directive Living will  Pre-existing out of facility DNR order (yellow form or pink MOST form) --  "MOST" Form in Place? --      Prognosis: Poor long-term prognosis with acute respiratory failure secondary to pneumonia, suspected metastatic lung cancer, and diastolic heart failure. Widespread metastatic disease on PET scan.   Discharge Planning: Home with hospice  Care plan was discussed with RN, patient, daughter, hospice liaison  Thank you for allowing the Palliative Medicine Team to assist in the care of this patient.   Total Time 15 Prolonged Time Billed  no    Greater than 50% of this time was spent counseling and coordinating care related to the above assessment and plan.   Ihor Dow, DNP, FNP-C Palliative Medicine Team  Phone:  [phone] Fax: [phone]  Please contact Palliative Medicine Team phone at [phone] for questions and concerns.

## 2020-06-11 ENCOUNTER — Ambulatory Visit: Payer: Medicare PPO

## 2020-06-11 MED ORDER — IPRATROPIUM-ALBUTEROL 0.5-2.5 (3) MG/3ML IN SOLN
3.0000 mL | Freq: Two times a day (BID) | RESPIRATORY_TRACT | Status: DC
  Administered 2020-06-11: 3 mL via RESPIRATORY_TRACT
  Filled 2020-06-11: qty 3

## 2020-06-11 MED ORDER — ALBUTEROL SULFATE (2.5 MG/3ML) 0.083% IN NEBU: 2.5000 mg | INHALATION_SOLUTION | RESPIRATORY_TRACT | 12 refills | Status: AC | PRN

## 2020-06-11 NOTE — Progress Notes (Signed)
   06/11/20 1430  Clinical Encounter Type  Visited With Patient and family together  Visit Type Initial;Spiritual support;Social support  Referral From Nurse  Consult/Referral To [place] visited room 149-A, Mrs. Kanita Delage. Mrs. Stlouis was resting and her daughter Ms.Nasira Janusz was sitting by her bedside.  I used reflective listening and afterwards offered words of encouragement and prayer. Her daughter Gracy Bruins me for the visit and prayer. I advised Barnetta Chapel I would try to see her mother again before she is discharged on Friday.

## 2020-06-11 NOTE — Progress Notes (Signed)
[facility]) Hospital Liaison RN note:  Visited with patient and daughter, Tye Maryland in room. Patient is resting comfortably and eager to go home. Tye Maryland reports that all DME has been delivered and is in place. [place], TOC notified. Plan is for discharge this evening. Non-emergent EMS transport has been arranged.  Please send signed and completed DNR home with patient. Please provide prescriptions at discharge as needed to ensure ongoing symptom management.  Thank you for the opportunity to participate in this patient's care.  Zandra Abts, RN [facility] Liaison [phone]

## 2020-06-11 NOTE — Plan of Care (Signed)
  Problem: Education: Goal: Knowledge of General Education information will improve Description: Including pain rating scale, medication(s)/side effects and non-pharmacologic comfort measures Outcome: Completed/Met   Problem: Activity: Goal: Ability to tolerate increased activity will improve Outcome: Completed/Met

## 2020-06-11 NOTE — Progress Notes (Signed)
Patient discharged per orders, PIV removed. Discharged reviewed with patient and daughter at bedside. Transported home via medical transport.

## 2020-06-12 ENCOUNTER — Encounter: Payer: Self-pay | Admitting: *Deleted

## 2020-06-12 ENCOUNTER — Ambulatory Visit: Payer: Medicare PPO

## 2020-06-12 NOTE — Progress Notes (Signed)
  Oncology Nurse Navigator Documentation  Navigator Location: [facility] (06/12/20 0900)   )Navigator Encounter Type: Appt/Treatment Plan Review (06/12/20 0900)                         Barriers/Navigation Needs: No Needs (06/12/20 0900)   Interventions: None Required (06/12/20 0900)             chart reviewed. Pt has been referred to hospice. No further navigational needs at this time.         Time Spent with Patient: 15 (06/12/20 0900)

## 2020-06-13 ENCOUNTER — Ambulatory Visit: Payer: Medicare PPO

## 2020-06-14 ENCOUNTER — Ambulatory Visit: Payer: Medicare PPO

## 2020-06-17 ENCOUNTER — Ambulatory Visit: Payer: Medicare PPO

## 2020-06-18 ENCOUNTER — Ambulatory Visit: Payer: Medicare PPO

## 2020-06-19 ENCOUNTER — Ambulatory Visit: Payer: Medicare PPO

## 2020-06-20 ENCOUNTER — Ambulatory Visit: Payer: Medicare PPO

## 2020-06-21 ENCOUNTER — Ambulatory Visit: Payer: Medicare PPO

## 2020-06-24 ENCOUNTER — Ambulatory Visit: Payer: Medicare PPO

## 2020-06-25 ENCOUNTER — Ambulatory Visit: Payer: Medicare PPO

## 2020-06-26 ENCOUNTER — Ambulatory Visit: Payer: Medicare PPO

## 2020-06-27 ENCOUNTER — Ambulatory Visit: Payer: Medicare PPO

## 2020-06-28 ENCOUNTER — Ambulatory Visit: Payer: Medicare PPO

## 2020-07-01 ENCOUNTER — Ambulatory Visit: Payer: Medicare PPO

## 2020-07-02 ENCOUNTER — Ambulatory Visit: Payer: Medicare PPO

## 2020-07-03 ENCOUNTER — Ambulatory Visit: Payer: Medicare PPO

## 2020-07-04 ENCOUNTER — Ambulatory Visit: Payer: Medicare PPO

## 2020-07-05 ENCOUNTER — Ambulatory Visit: Payer: Medicare PPO

## 2020-07-08 ENCOUNTER — Ambulatory Visit: Payer: Medicare PPO

## 2020-07-09 ENCOUNTER — Ambulatory Visit: Payer: Medicare PPO

## 2020-07-10 ENCOUNTER — Ambulatory Visit: Payer: Medicare PPO

## 2020-07-11 ENCOUNTER — Ambulatory Visit: Payer: Medicare PPO

## 2020-07-12 ENCOUNTER — Ambulatory Visit: Payer: Medicare PPO

## 2020-07-15 ENCOUNTER — Ambulatory Visit: Payer: Medicare PPO

## 2020-07-16 ENCOUNTER — Ambulatory Visit: Payer: Medicare PPO

## 2020-07-17 ENCOUNTER — Ambulatory Visit: Payer: Medicare PPO

## 2020-07-23 DEATH — deceased

## 2022-09-02 IMAGING — CT CT CHEST W/O CM
2 of 4 series · 14 of 36 positions shown, 17 images · non-contrast
Comparison: MRI brachial plexus 05/01/2020.  CT chest 05/24/2019

CLINICAL DATA: Right apical mass.

EXAM:
CT CHEST WITHOUT CONTRAST
TECHNIQUE: Multidetector CT imaging of the chest was performed following the
standard protocol without IV contrast.

[Series 2: chest 2.00 · axial · 0.70mm/px · z∈[-1199,-941]mm · 11 of 153 slices shown, 14 images]
[im 12/153  mediastinal]
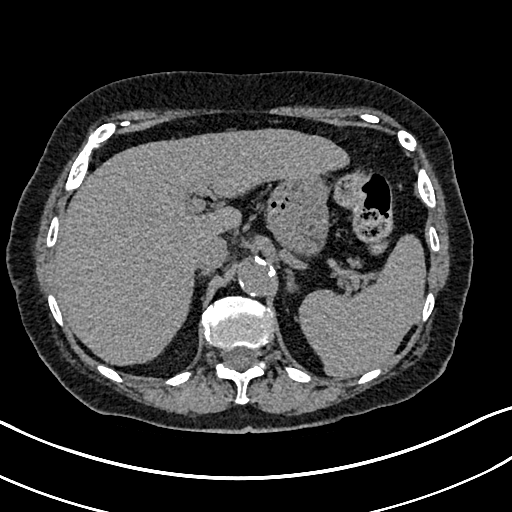
[im 12/153  lung]
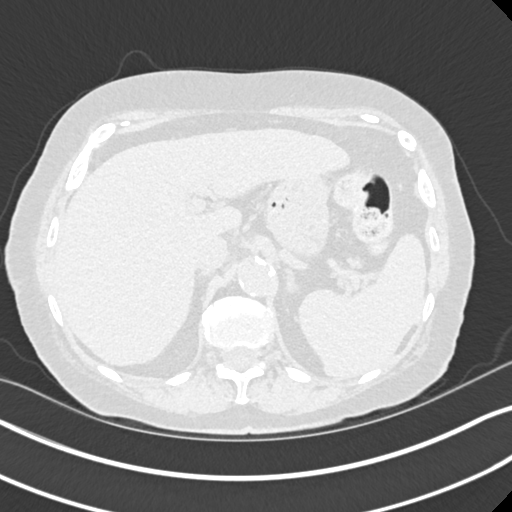
[im 24/153  lung]
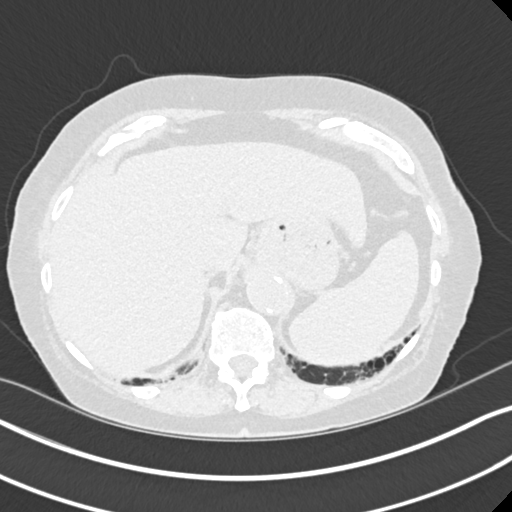
[im 36/153  lung]
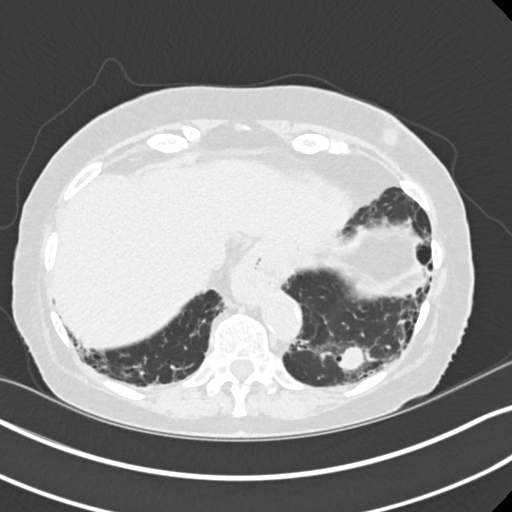
[im 47/153  lung]
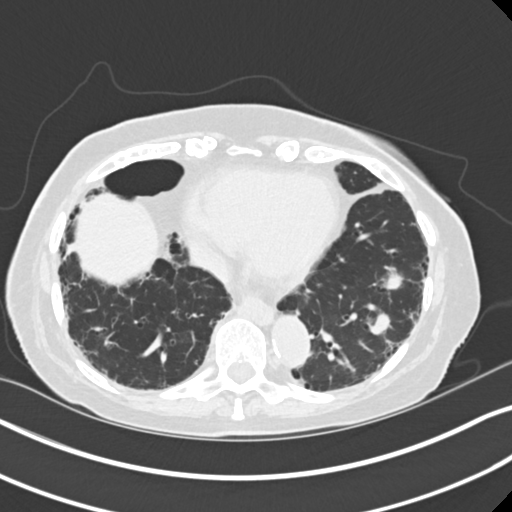
[im 59/153  mediastinal]
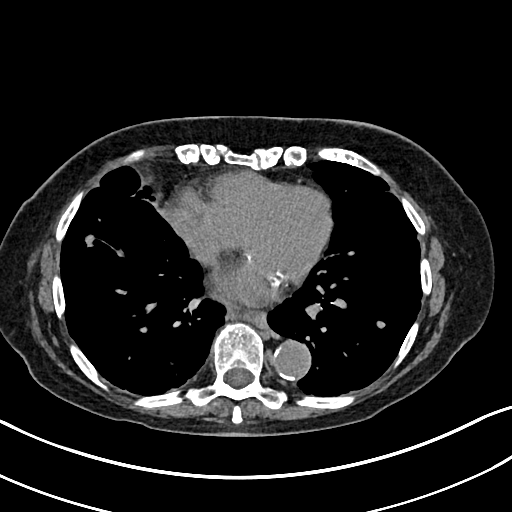
[im 59/153  lung]
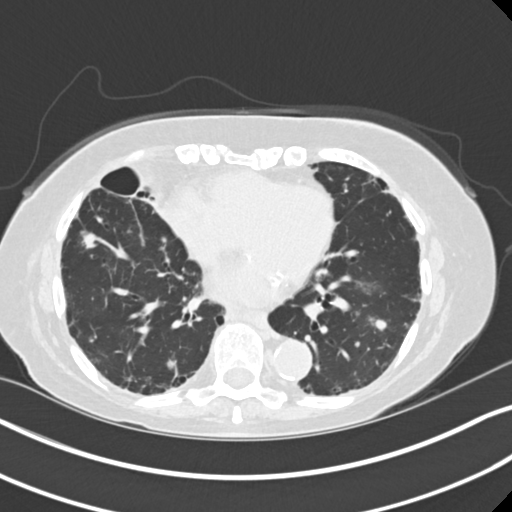
[im 82/153  lung]
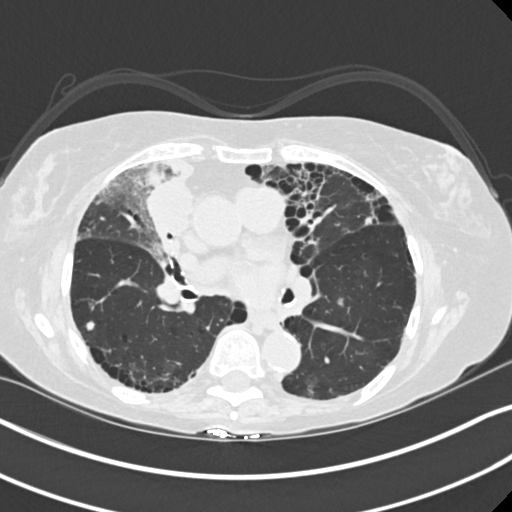
[im 94/153  lung]
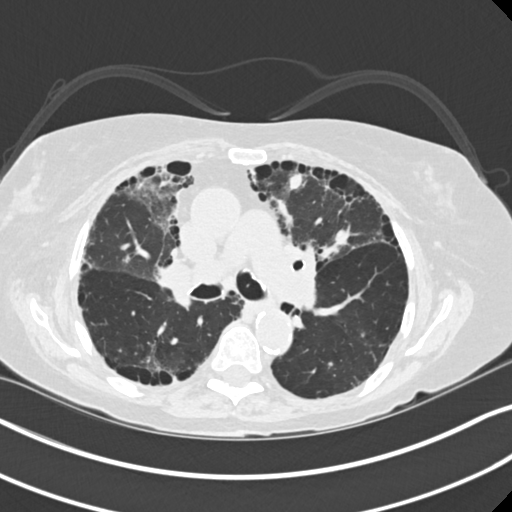
[im 106/153  lung]
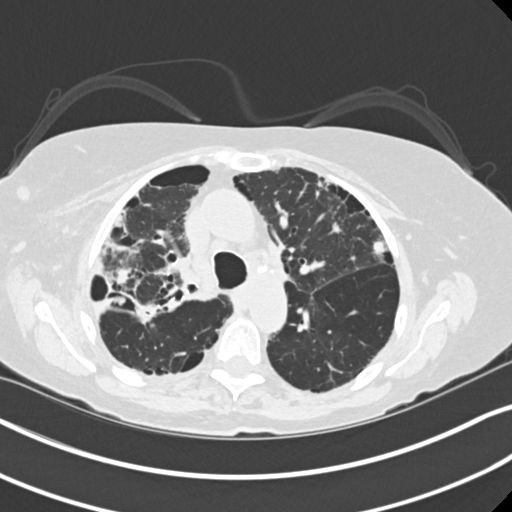
[im 117/153  mediastinal]
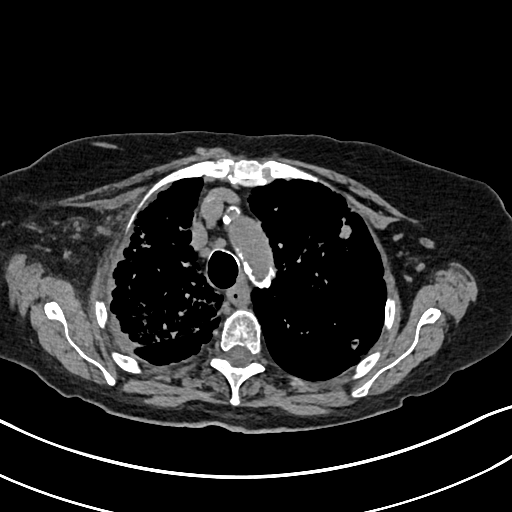
[im 117/153  lung]
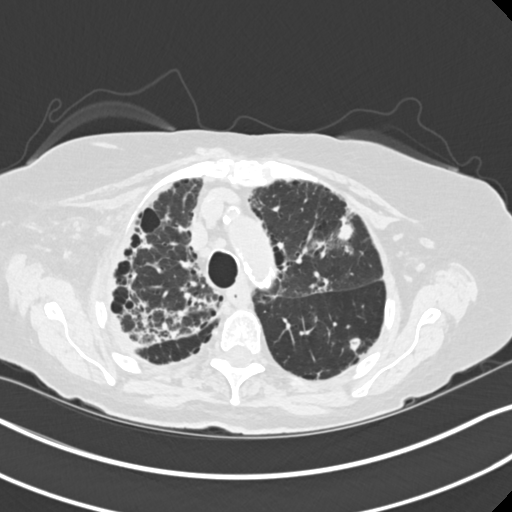
[im 129/153  lung]
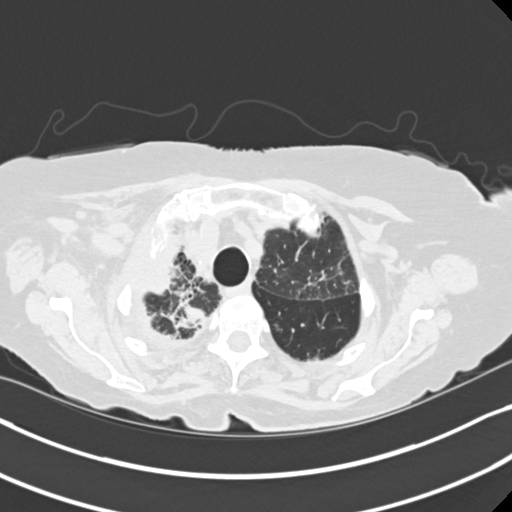
[im 141/153  lung]
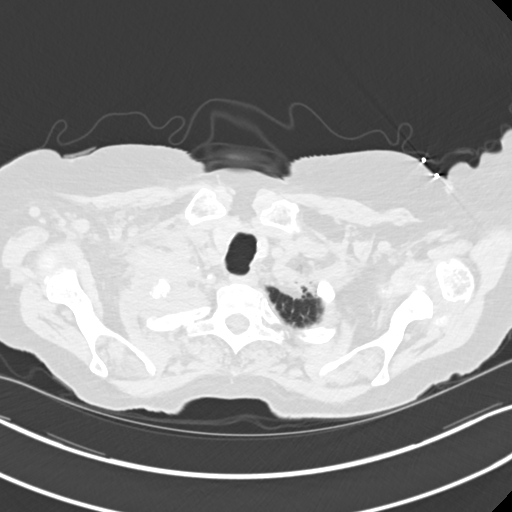

[Series 5: coronals chest 2.00 cor · coronal · 0.60mm/px · 3 of 148 slices shown]
[im 30/148  lung]
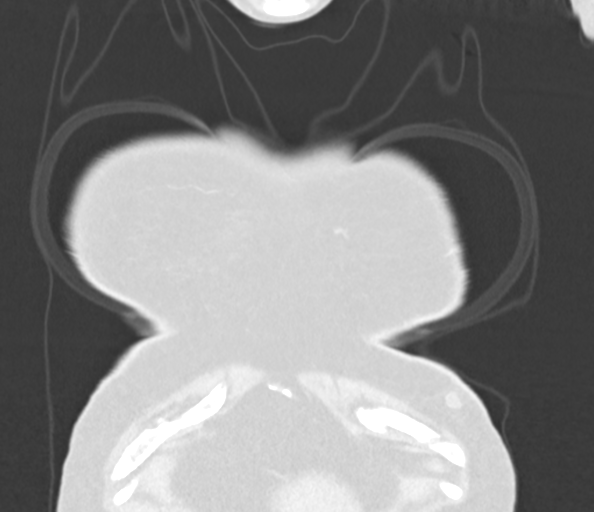
[im 59/148  lung]
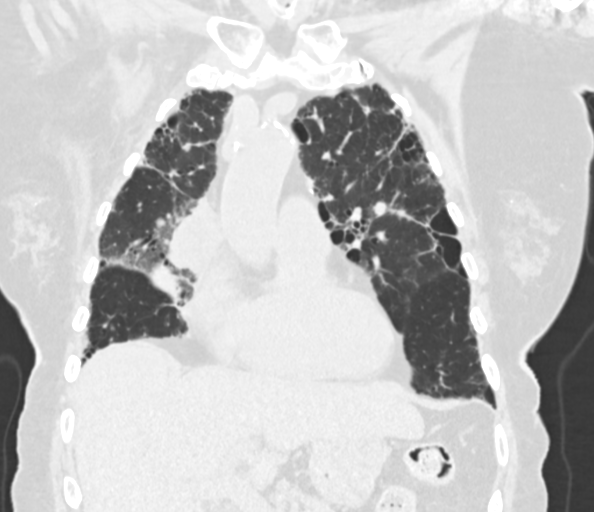
[im 89/148  lung]
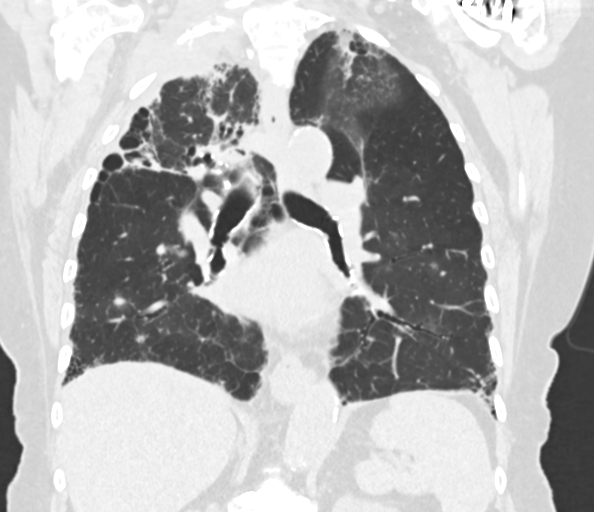

[14 of 36 positions shown; findings below may reference images not displayed]

FINDINGS: Cardiovascular: Aortic Atherosclerosis (ZDP02-SG5.5).

Mediastinum/Nodes: Mediastinal adenopathy is present. 1.6 cm lower
paratracheal node, image 54 series 2. Right hilar adenopathy,
difficult to measure separate from the vessels on today's
noncontrast exam. Scattered right axillary adenopathy including a
1.0 cm right axillary node on image 34 series 2.

Small hiatal hernia.

Lungs/Pleura: Scattered bilateral pulmonary nodules compatible with
metastatic disease. Large right apical mass invading the chest wall
with bony destruction of the right first rib and invasion of the
right second rib as well as the right T1 transverse process. The
right apical mass is characterized in detail on the brachial plexus
MRI.

There is a cavitary right middle lobe mass measuring 5.6 by 4.8 cm
on image 79 of series 3. Surrounding ground-glass opacity in the
lung may reflect pulmonary hemorrhage or associated lymphangitic
carcinomatosis or postobstructive pneumonitis.

Coarse interstitial accentuation at the right lung apex likewise is
suspicious for lymphangitic carcinomatosis.

An index nodule in the left lower lobe measures 1.7 by 1.3 cm on
image 80 of series 3. Numerous other scattered pulmonary nodules are
present of varying sizes.

Paraseptal emphysema.

Upper Abdomen: Today's noncontrast examination is suboptimal for
assessing the solid organs for malignancy. No obvious mass is seen
in the upper abdomen.

Musculoskeletal: A subcutaneous nodule is present anterior to the
left thoracoabdominal wall on image 20 of series 2 measuring 1.4 by
1.1 cm, below the left breast. This was not present on 05/24/2019
and could represent a metastatic lesion.
IMPRESSION: 1. Large right apical mass invading the chest wall, with bony
destruction of the right first rib, right second rib, and right T1
transverse process. Surrounding ground-glass opacity in the right
middle lobe and right upper lobe favoring lymphangitic
carcinomatosis.
2. 5.6 cm cavitary mass in the right middle lobe with surrounding
lymphangitic carcinomatosis or postobstructive pneumonitis.
3. Scattered bilateral pulmonary nodules compatible with metastatic
disease.
4. A subcutaneous nodule is present anterior to the left
thoracoabdominal wall below the left breast, not present on
05/24/2019, and could represent a metastatic lesion.
5. Mediastinal and right hilar adenopathy, compatible with
metastatic disease.
6. Emphysema and aortic atherosclerosis.

Aortic Atherosclerosis (ZDP02-SG5.5) and Emphysema (ZDP02-T9Z.0).
